# Patient Record
Sex: Female | Born: 1945 | Race: White | Hispanic: No | Smoking: Never smoker
Health system: Southern US, Community
[De-identification: ages and names within clinical notes are randomized; demographics above are authoritative.]

## PROBLEM LIST (undated history)

## (undated) DIAGNOSIS — I10 Essential (primary) hypertension: Secondary | ICD-10-CM

## (undated) DIAGNOSIS — G4730 Sleep apnea, unspecified: Secondary | ICD-10-CM

## (undated) DIAGNOSIS — I8390 Asymptomatic varicose veins of unspecified lower extremity: Secondary | ICD-10-CM

## (undated) DIAGNOSIS — J45909 Unspecified asthma, uncomplicated: Secondary | ICD-10-CM

## (undated) DIAGNOSIS — M1990 Unspecified osteoarthritis, unspecified site: Secondary | ICD-10-CM

## (undated) DIAGNOSIS — R569 Unspecified convulsions: Secondary | ICD-10-CM

## (undated) HISTORY — PX: OTHER SURGICAL HISTORY: SHX169

---

## 1978-11-16 HISTORY — PX: BREAST EXCISIONAL BIOPSY: SUR124

## 1982-11-16 HISTORY — PX: OTHER SURGICAL HISTORY: SHX169

## 1998-11-16 HISTORY — PX: REDUCTION MAMMAPLASTY: SUR839

## 2020-09-18 ENCOUNTER — Other Ambulatory Visit: Payer: Self-pay | Admitting: Internal Medicine

## 2020-09-18 DIAGNOSIS — Z1231 Encounter for screening mammogram for malignant neoplasm of breast: Secondary | ICD-10-CM

## 2020-12-03 ENCOUNTER — Other Ambulatory Visit: Payer: Self-pay

## 2020-12-03 ENCOUNTER — Ambulatory Visit
Admission: RE | Admit: 2020-12-03 | Discharge: 2020-12-03 | Disposition: A | Discharge status: Home / Self Care | Payer: Medicare HMO | Source: Ambulatory Visit | Attending: Internal Medicine | Admitting: Internal Medicine

## 2020-12-03 DIAGNOSIS — Z1231 Encounter for screening mammogram for malignant neoplasm of breast: Secondary | ICD-10-CM | POA: Diagnosis present

## 2020-12-16 ENCOUNTER — Ambulatory Visit: Payer: Medicare HMO | Attending: Internal Medicine

## 2020-12-16 DIAGNOSIS — G4733 Obstructive sleep apnea (adult) (pediatric): Secondary | ICD-10-CM | POA: Insufficient documentation

## 2020-12-16 DIAGNOSIS — G4761 Periodic limb movement disorder: Secondary | ICD-10-CM | POA: Diagnosis not present

## 2020-12-17 ENCOUNTER — Other Ambulatory Visit: Payer: Self-pay

## 2021-06-24 DIAGNOSIS — H25813 Combined forms of age-related cataract, bilateral: Secondary | ICD-10-CM | POA: Diagnosis not present

## 2021-08-04 DIAGNOSIS — Z6835 Body mass index (BMI) 35.0-35.9, adult: Secondary | ICD-10-CM | POA: Diagnosis not present

## 2021-08-04 DIAGNOSIS — Z9989 Dependence on other enabling machines and devices: Secondary | ICD-10-CM | POA: Diagnosis not present

## 2021-08-04 DIAGNOSIS — J4520 Mild intermittent asthma, uncomplicated: Secondary | ICD-10-CM | POA: Diagnosis not present

## 2021-08-04 DIAGNOSIS — G4733 Obstructive sleep apnea (adult) (pediatric): Secondary | ICD-10-CM | POA: Diagnosis not present

## 2021-08-04 DIAGNOSIS — R8290 Unspecified abnormal findings in urine: Secondary | ICD-10-CM | POA: Diagnosis not present

## 2021-08-04 DIAGNOSIS — R7309 Other abnormal glucose: Secondary | ICD-10-CM | POA: Diagnosis not present

## 2021-08-04 DIAGNOSIS — M25473 Effusion, unspecified ankle: Secondary | ICD-10-CM | POA: Diagnosis not present

## 2021-08-04 DIAGNOSIS — I10 Essential (primary) hypertension: Secondary | ICD-10-CM | POA: Diagnosis not present

## 2021-08-04 DIAGNOSIS — J302 Other seasonal allergic rhinitis: Secondary | ICD-10-CM | POA: Diagnosis not present

## 2021-08-11 DIAGNOSIS — Z6835 Body mass index (BMI) 35.0-35.9, adult: Secondary | ICD-10-CM | POA: Diagnosis not present

## 2021-08-11 DIAGNOSIS — Z9989 Dependence on other enabling machines and devices: Secondary | ICD-10-CM | POA: Diagnosis not present

## 2021-08-11 DIAGNOSIS — Z23 Encounter for immunization: Secondary | ICD-10-CM | POA: Diagnosis not present

## 2021-08-11 DIAGNOSIS — J302 Other seasonal allergic rhinitis: Secondary | ICD-10-CM | POA: Diagnosis not present

## 2021-08-11 DIAGNOSIS — G4733 Obstructive sleep apnea (adult) (pediatric): Secondary | ICD-10-CM | POA: Diagnosis not present

## 2021-08-11 DIAGNOSIS — I10 Essential (primary) hypertension: Secondary | ICD-10-CM | POA: Diagnosis not present

## 2021-08-11 DIAGNOSIS — L989 Disorder of the skin and subcutaneous tissue, unspecified: Secondary | ICD-10-CM | POA: Diagnosis not present

## 2021-08-11 DIAGNOSIS — M25473 Effusion, unspecified ankle: Secondary | ICD-10-CM | POA: Diagnosis not present

## 2021-08-22 DIAGNOSIS — G4733 Obstructive sleep apnea (adult) (pediatric): Secondary | ICD-10-CM | POA: Diagnosis not present

## 2021-09-22 DIAGNOSIS — D2272 Melanocytic nevi of left lower limb, including hip: Secondary | ICD-10-CM | POA: Diagnosis not present

## 2021-09-22 DIAGNOSIS — D2261 Melanocytic nevi of right upper limb, including shoulder: Secondary | ICD-10-CM | POA: Diagnosis not present

## 2021-09-22 DIAGNOSIS — D2262 Melanocytic nevi of left upper limb, including shoulder: Secondary | ICD-10-CM | POA: Diagnosis not present

## 2021-09-22 DIAGNOSIS — D225 Melanocytic nevi of trunk: Secondary | ICD-10-CM | POA: Diagnosis not present

## 2021-09-22 DIAGNOSIS — D2271 Melanocytic nevi of right lower limb, including hip: Secondary | ICD-10-CM | POA: Diagnosis not present

## 2021-09-22 DIAGNOSIS — L821 Other seborrheic keratosis: Secondary | ICD-10-CM | POA: Diagnosis not present

## 2021-12-17 DIAGNOSIS — N390 Urinary tract infection, site not specified: Secondary | ICD-10-CM | POA: Diagnosis not present

## 2021-12-17 DIAGNOSIS — R350 Frequency of micturition: Secondary | ICD-10-CM | POA: Diagnosis not present

## 2022-02-04 DIAGNOSIS — Z9989 Dependence on other enabling machines and devices: Secondary | ICD-10-CM | POA: Diagnosis not present

## 2022-02-04 DIAGNOSIS — I10 Essential (primary) hypertension: Secondary | ICD-10-CM | POA: Diagnosis not present

## 2022-02-04 DIAGNOSIS — L989 Disorder of the skin and subcutaneous tissue, unspecified: Secondary | ICD-10-CM | POA: Diagnosis not present

## 2022-02-04 DIAGNOSIS — R7309 Other abnormal glucose: Secondary | ICD-10-CM | POA: Diagnosis not present

## 2022-02-04 DIAGNOSIS — J302 Other seasonal allergic rhinitis: Secondary | ICD-10-CM | POA: Diagnosis not present

## 2022-02-04 DIAGNOSIS — M25473 Effusion, unspecified ankle: Secondary | ICD-10-CM | POA: Diagnosis not present

## 2022-02-04 DIAGNOSIS — G4733 Obstructive sleep apnea (adult) (pediatric): Secondary | ICD-10-CM | POA: Diagnosis not present

## 2022-02-04 DIAGNOSIS — Z6835 Body mass index (BMI) 35.0-35.9, adult: Secondary | ICD-10-CM | POA: Diagnosis not present

## 2022-02-04 DIAGNOSIS — R8290 Unspecified abnormal findings in urine: Secondary | ICD-10-CM | POA: Diagnosis not present

## 2022-02-12 DIAGNOSIS — I10 Essential (primary) hypertension: Secondary | ICD-10-CM | POA: Diagnosis not present

## 2022-02-12 DIAGNOSIS — Z6835 Body mass index (BMI) 35.0-35.9, adult: Secondary | ICD-10-CM | POA: Diagnosis not present

## 2022-02-12 DIAGNOSIS — Z9989 Dependence on other enabling machines and devices: Secondary | ICD-10-CM | POA: Diagnosis not present

## 2022-02-12 DIAGNOSIS — G4733 Obstructive sleep apnea (adult) (pediatric): Secondary | ICD-10-CM | POA: Diagnosis not present

## 2022-02-12 DIAGNOSIS — M25561 Pain in right knee: Secondary | ICD-10-CM | POA: Diagnosis not present

## 2022-02-12 DIAGNOSIS — Z0000 Encounter for general adult medical examination without abnormal findings: Secondary | ICD-10-CM | POA: Diagnosis not present

## 2022-02-12 DIAGNOSIS — Z1331 Encounter for screening for depression: Secondary | ICD-10-CM | POA: Diagnosis not present

## 2022-02-15 DIAGNOSIS — M25561 Pain in right knee: Secondary | ICD-10-CM | POA: Diagnosis not present

## 2022-02-15 DIAGNOSIS — M2391 Unspecified internal derangement of right knee: Secondary | ICD-10-CM | POA: Diagnosis not present

## 2022-02-24 DIAGNOSIS — M2391 Unspecified internal derangement of right knee: Secondary | ICD-10-CM | POA: Diagnosis not present

## 2022-02-24 DIAGNOSIS — M25561 Pain in right knee: Secondary | ICD-10-CM | POA: Diagnosis not present

## 2022-02-25 ENCOUNTER — Other Ambulatory Visit: Payer: Self-pay | Admitting: Sports Medicine

## 2022-02-25 DIAGNOSIS — M2391 Unspecified internal derangement of right knee: Secondary | ICD-10-CM

## 2022-02-25 DIAGNOSIS — M25561 Pain in right knee: Secondary | ICD-10-CM

## 2022-03-02 ENCOUNTER — Ambulatory Visit
Admission: RE | Admit: 2022-03-02 | Discharge: 2022-03-02 | Disposition: A | Discharge status: Home / Self Care | Payer: Medicare HMO | Source: Ambulatory Visit | Attending: Sports Medicine | Admitting: Sports Medicine

## 2022-03-02 DIAGNOSIS — M7121 Synovial cyst of popliteal space [Baker], right knee: Secondary | ICD-10-CM | POA: Diagnosis not present

## 2022-03-02 DIAGNOSIS — M7989 Other specified soft tissue disorders: Secondary | ICD-10-CM | POA: Diagnosis not present

## 2022-03-02 DIAGNOSIS — S83231A Complex tear of medial meniscus, current injury, right knee, initial encounter: Secondary | ICD-10-CM | POA: Diagnosis not present

## 2022-03-02 DIAGNOSIS — M2391 Unspecified internal derangement of right knee: Secondary | ICD-10-CM | POA: Insufficient documentation

## 2022-03-02 DIAGNOSIS — M1711 Unilateral primary osteoarthritis, right knee: Secondary | ICD-10-CM | POA: Diagnosis not present

## 2022-03-02 DIAGNOSIS — M25561 Pain in right knee: Secondary | ICD-10-CM | POA: Insufficient documentation

## 2022-03-11 DIAGNOSIS — M25461 Effusion, right knee: Secondary | ICD-10-CM | POA: Diagnosis not present

## 2022-03-11 DIAGNOSIS — S83241D Other tear of medial meniscus, current injury, right knee, subsequent encounter: Secondary | ICD-10-CM | POA: Diagnosis not present

## 2022-03-11 DIAGNOSIS — S8991XD Unspecified injury of right lower leg, subsequent encounter: Secondary | ICD-10-CM | POA: Diagnosis not present

## 2022-03-11 DIAGNOSIS — M25561 Pain in right knee: Secondary | ICD-10-CM | POA: Diagnosis not present

## 2022-03-11 DIAGNOSIS — M1711 Unilateral primary osteoarthritis, right knee: Secondary | ICD-10-CM | POA: Diagnosis not present

## 2022-03-12 ENCOUNTER — Other Ambulatory Visit: Payer: Self-pay

## 2022-03-12 ENCOUNTER — Other Ambulatory Visit: Payer: Self-pay | Admitting: Orthopedic Surgery

## 2022-03-12 ENCOUNTER — Encounter: Payer: Self-pay | Admitting: Orthopedic Surgery

## 2022-03-12 DIAGNOSIS — S83241A Other tear of medial meniscus, current injury, right knee, initial encounter: Secondary | ICD-10-CM | POA: Diagnosis not present

## 2022-03-19 ENCOUNTER — Other Ambulatory Visit: Payer: Self-pay

## 2022-03-19 ENCOUNTER — Ambulatory Visit: Payer: Medicare HMO | Admitting: Anesthesiology

## 2022-03-19 ENCOUNTER — Ambulatory Visit
Admission: RE | Admit: 2022-03-19 | Discharge: 2022-03-19 | Disposition: A | Discharge status: Home / Self Care | Payer: Medicare HMO | Source: Ambulatory Visit | Attending: Orthopedic Surgery | Admitting: Orthopedic Surgery

## 2022-03-19 ENCOUNTER — Encounter: Payer: Self-pay | Admitting: Orthopedic Surgery

## 2022-03-19 ENCOUNTER — Encounter
Admission: RE | Disposition: A | Discharge status: Home / Self Care | Payer: Self-pay | Source: Ambulatory Visit | Attending: Orthopedic Surgery

## 2022-03-19 DIAGNOSIS — M65861 Other synovitis and tenosynovitis, right lower leg: Secondary | ICD-10-CM | POA: Diagnosis not present

## 2022-03-19 DIAGNOSIS — X58XXXA Exposure to other specified factors, initial encounter: Secondary | ICD-10-CM | POA: Insufficient documentation

## 2022-03-19 DIAGNOSIS — S83241A Other tear of medial meniscus, current injury, right knee, initial encounter: Secondary | ICD-10-CM | POA: Diagnosis not present

## 2022-03-19 DIAGNOSIS — M2242 Chondromalacia patellae, left knee: Secondary | ICD-10-CM | POA: Diagnosis not present

## 2022-03-19 DIAGNOSIS — I10 Essential (primary) hypertension: Secondary | ICD-10-CM | POA: Diagnosis not present

## 2022-03-19 DIAGNOSIS — G4730 Sleep apnea, unspecified: Secondary | ICD-10-CM | POA: Diagnosis not present

## 2022-03-19 DIAGNOSIS — M659 Synovitis and tenosynovitis, unspecified: Secondary | ICD-10-CM | POA: Insufficient documentation

## 2022-03-19 DIAGNOSIS — J45909 Unspecified asthma, uncomplicated: Secondary | ICD-10-CM | POA: Diagnosis not present

## 2022-03-19 DIAGNOSIS — M94261 Chondromalacia, right knee: Secondary | ICD-10-CM | POA: Diagnosis not present

## 2022-03-19 DIAGNOSIS — Z6835 Body mass index (BMI) 35.0-35.9, adult: Secondary | ICD-10-CM | POA: Diagnosis not present

## 2022-03-19 DIAGNOSIS — M25861 Other specified joint disorders, right knee: Secondary | ICD-10-CM | POA: Diagnosis not present

## 2022-03-19 DIAGNOSIS — G8918 Other acute postprocedural pain: Secondary | ICD-10-CM | POA: Diagnosis not present

## 2022-03-19 DIAGNOSIS — S83231A Complex tear of medial meniscus, current injury, right knee, initial encounter: Secondary | ICD-10-CM | POA: Diagnosis not present

## 2022-03-19 DIAGNOSIS — M25561 Pain in right knee: Secondary | ICD-10-CM | POA: Diagnosis not present

## 2022-03-19 DIAGNOSIS — E785 Hyperlipidemia, unspecified: Secondary | ICD-10-CM | POA: Insufficient documentation

## 2022-03-19 HISTORY — PX: CHONDROPLASTY: SHX5177

## 2022-03-19 HISTORY — DX: Unspecified asthma, uncomplicated: J45.909

## 2022-03-19 HISTORY — DX: Essential (primary) hypertension: I10

## 2022-03-19 HISTORY — PX: KNEE ARTHROSCOPY: SHX127

## 2022-03-19 HISTORY — DX: Unspecified convulsions: R56.9

## 2022-03-19 HISTORY — DX: Asymptomatic varicose veins of unspecified lower extremity: I83.90

## 2022-03-19 HISTORY — DX: Sleep apnea, unspecified: G47.30

## 2022-03-19 HISTORY — DX: Unspecified osteoarthritis, unspecified site: M19.90

## 2022-03-19 SURGERY — ARTHROSCOPY, KNEE
Anesthesia: General | Site: Knee | Laterality: Right

## 2022-03-19 MED ORDER — ACETAMINOPHEN 325 MG PO TABS: 325.0000 mg | ORAL_TABLET | ORAL | Status: DC | PRN

## 2022-03-19 MED ORDER — OXYCODONE HCL 5 MG PO TABS: 5.0000 mg | ORAL_TABLET | Freq: Once | ORAL | Status: DC | PRN

## 2022-03-19 MED ORDER — GLYCOPYRROLATE 0.2 MG/ML IJ SOLN
INTRAMUSCULAR | Status: DC | PRN
  Administered 2022-03-19: .1 mg via INTRAVENOUS

## 2022-03-19 MED ORDER — LACTATED RINGERS IV SOLN: INTRAVENOUS | Status: DC

## 2022-03-19 MED ORDER — FENTANYL CITRATE (PF) 100 MCG/2ML IJ SOLN
INTRAMUSCULAR | Status: DC | PRN
  Administered 2022-03-19: 50 ug via INTRAVENOUS
  Administered 2022-03-19: 25 ug via INTRAVENOUS

## 2022-03-19 MED ORDER — OXYCODONE HCL 5 MG/5ML PO SOLN: 5.0000 mg | Freq: Once | ORAL | Status: DC | PRN

## 2022-03-19 MED ORDER — ONDANSETRON HCL 4 MG/2ML IJ SOLN
INTRAMUSCULAR | Status: DC | PRN
Start: 2022-03-19 — End: 2022-03-19
  Administered 2022-03-19: 4 mg via INTRAVENOUS

## 2022-03-19 MED ORDER — ACETAMINOPHEN 160 MG/5ML PO SOLN: 325.0000 mg | ORAL | Status: DC | PRN

## 2022-03-19 MED ORDER — IBUPROFEN 800 MG PO TABS: 800.0000 mg | ORAL_TABLET | Freq: Three times a day (TID) | ORAL | 1 refills | Status: AC

## 2022-03-19 MED ORDER — ONDANSETRON HCL 4 MG/2ML IJ SOLN: 4.0000 mg | Freq: Once | INTRAMUSCULAR | Status: DC | PRN

## 2022-03-19 MED ORDER — CEFAZOLIN SODIUM-DEXTROSE 2-4 GM/100ML-% IV SOLN
2.0000 g | INTRAVENOUS | Status: AC
Start: 2022-03-19 — End: 2022-03-19
  Administered 2022-03-19: 2 g via INTRAVENOUS

## 2022-03-19 MED ORDER — FENTANYL CITRATE PF 50 MCG/ML IJ SOSY: 25.0000 ug | PREFILLED_SYRINGE | INTRAMUSCULAR | Status: DC | PRN

## 2022-03-19 MED ORDER — BUPIVACAINE LIPOSOME 1.3 % IJ SUSP
INTRAMUSCULAR | Status: DC | PRN
  Administered 2022-03-19: 20 mL via PERINEURAL

## 2022-03-19 MED ORDER — MIDAZOLAM HCL 2 MG/2ML IJ SOLN
INTRAMUSCULAR | Status: DC | PRN
Start: 2022-03-19 — End: 2022-03-19
  Administered 2022-03-19: 2 mg via INTRAVENOUS

## 2022-03-19 MED ORDER — EPHEDRINE SULFATE (PRESSORS) 50 MG/ML IJ SOLN
INTRAMUSCULAR | Status: DC | PRN
  Administered 2022-03-19: 5 mg via INTRAVENOUS
  Administered 2022-03-19: 10 mg via INTRAVENOUS

## 2022-03-19 MED ORDER — LACTATED RINGERS IR SOLN
Status: DC | PRN
  Administered 2022-03-19: 12000 mL

## 2022-03-19 MED ORDER — BUPIVACAINE HCL (PF) 0.5 % IJ SOLN
INTRAMUSCULAR | Status: DC | PRN
  Administered 2022-03-19: 20 mL via PERINEURAL

## 2022-03-19 MED ORDER — PROPOFOL 10 MG/ML IV BOLUS
INTRAVENOUS | Status: DC | PRN
  Administered 2022-03-19: 140 mg via INTRAVENOUS

## 2022-03-19 MED ORDER — OXYCODONE HCL 5 MG PO TABS: 5.0000 mg | ORAL_TABLET | ORAL | 0 refills | Status: AC | PRN

## 2022-03-19 MED ORDER — LIDOCAINE HCL (CARDIAC) PF 100 MG/5ML IV SOSY
PREFILLED_SYRINGE | INTRAVENOUS | Status: DC | PRN
  Administered 2022-03-19: 50 mg via INTRATRACHEAL

## 2022-03-19 MED ORDER — DEXAMETHASONE SODIUM PHOSPHATE 4 MG/ML IJ SOLN
INTRAMUSCULAR | Status: DC | PRN
Start: 2022-03-19 — End: 2022-03-19
  Administered 2022-03-19: 4 mg via INTRAVENOUS

## 2022-03-19 MED ORDER — ACETAMINOPHEN 500 MG PO TABS: 1000.0000 mg | ORAL_TABLET | Freq: Three times a day (TID) | ORAL | 2 refills | Status: AC

## 2022-03-19 MED ORDER — ASPIRIN EC 325 MG PO TBEC: 325.0000 mg | DELAYED_RELEASE_TABLET | Freq: Every day | ORAL | 0 refills | Status: AC

## 2022-03-19 MED ORDER — ONDANSETRON 4 MG PO TBDP: 4.0000 mg | ORAL_TABLET | Freq: Three times a day (TID) | ORAL | 0 refills | Status: AC | PRN

## 2022-03-19 SURGICAL SUPPLY — 64 items
ADH SKN CLS APL DERMABOND .7 (GAUZE/BANDAGES/DRESSINGS) ×1
ADPR IRR PORT MULTIBAG TUBE (MISCELLANEOUS) ×1
APL PRP STRL LF DISP 70% ISPRP (MISCELLANEOUS) ×1
BLADE FULL RADIUS 3.5 (BLADE) ×2 IMPLANT
BLADE SURG 15 STRL LF DISP TIS (BLADE) ×1 IMPLANT
BLADE SURG 15 STRL SS (BLADE) ×2
BLADE SURG SZ11 CARB STEEL (BLADE) ×2 IMPLANT
BNDG ADH 5X4 AIR PERM ELC (GAUZE/BANDAGES/DRESSINGS) ×1
BNDG COHESIVE 4X5 TAN ST LF (GAUZE/BANDAGES/DRESSINGS) ×2 IMPLANT
BNDG COHESIVE 4X5 WHT NS (GAUZE/BANDAGES/DRESSINGS) ×2 IMPLANT
BNDG ESMARK 6X12 TAN STRL LF (GAUZE/BANDAGES/DRESSINGS) ×4 IMPLANT
BRACE KNEE POST OP SHORT (BRACE) IMPLANT
CHLORAPREP W/TINT 26 (MISCELLANEOUS) ×2 IMPLANT
COOLER POLAR GLACIER W/PUMP (MISCELLANEOUS) ×4 IMPLANT
COVER LIGHT HANDLE UNIVERSAL (MISCELLANEOUS) ×4 IMPLANT
CUFF TOURN SGL QUICK 34 (TOURNIQUET CUFF) ×2
CUFF TRNQT CYL 34X4X40X1 (TOURNIQUET CUFF) IMPLANT
DERMABOND ADVANCED (GAUZE/BANDAGES/DRESSINGS) ×1
DERMABOND ADVANCED .7 DNX12 (GAUZE/BANDAGES/DRESSINGS) IMPLANT
DRAPE EXTREMITY T 121X128X90 (DISPOSABLE) ×2 IMPLANT
DRAPE IMP U-DRAPE 54X76 (DRAPES) ×2 IMPLANT
DRILL FLIPCUTTER III 6-12 (ORTHOPEDIC DISPOSABLE SUPPLIES) IMPLANT
ELECT REM PT RETURN 9FT ADLT (ELECTROSURGICAL) ×2
ELECTRODE REM PT RTRN 9FT ADLT (ELECTROSURGICAL) IMPLANT
FLIPCUTTER III 6-12 AR-1204FF (ORTHOPEDIC DISPOSABLE SUPPLIES) ×2
GAUZE SPONGE 4X4 12PLY STRL (GAUZE/BANDAGES/DRESSINGS) ×2 IMPLANT
GLOVE SURG ENC MOIS LTX SZ7.5 (GLOVE) ×2 IMPLANT
GLOVE SURG UNDER LTX SZ8 (GLOVE) ×2 IMPLANT
GOWN STRL REIN 2XL XLG LVL4 (GOWN DISPOSABLE) ×2 IMPLANT
GOWN STRL REUS W/TWL LRG LVL3 (GOWN DISPOSABLE) ×2 IMPLANT
IMP SYS 2ND FIX PEEK 4.75X19.1 (Miscellaneous) ×2 IMPLANT
IMPL SYS 2ND FX PEEK 4.75X19.1 (Miscellaneous) IMPLANT
IMPL SYS MENISCAL ROOT REPAIR (Orthopedic Implant) ×1 IMPLANT
IV LACTATED RINGER IRRG 3000ML (IV SOLUTION) ×8
IV LR IRRIG 3000ML ARTHROMATIC (IV SOLUTION) ×4 IMPLANT
KIT TURNOVER KIT A (KITS) ×2 IMPLANT
MANIFOLD NEPTUNE II (INSTRUMENTS) ×2 IMPLANT
MAT ABSORB  FLUID 56X50 GRAY (MISCELLANEOUS) ×1
MAT ABSORB FLUID 56X50 GRAY (MISCELLANEOUS) ×1 IMPLANT
NDL SAFETY ECLIPSE 18X1.5 (NEEDLE) ×1 IMPLANT
NDL SUT 2-0 SCORPION KNEE (NEEDLE) IMPLANT
NEEDLE HYPO 18GX1.5 SHARP (NEEDLE) ×2
NEEDLE SUT 2-0 SCORPION KNEE (NEEDLE) ×2 IMPLANT
PACK ARTHROSCOPY KNEE (MISCELLANEOUS) ×2 IMPLANT
PAD ABD DERMACEA PRESS 5X9 (GAUZE/BANDAGES/DRESSINGS) ×2 IMPLANT
PAD WRAPON POLAR KNEE (MISCELLANEOUS) ×1 IMPLANT
PADDING CAST BLEND 6X4 STRL (MISCELLANEOUS) ×1 IMPLANT
PADDING STRL CAST 6IN (MISCELLANEOUS) ×1
PENCIL SMOKE EVACUATOR (MISCELLANEOUS) ×1 IMPLANT
SET Y ADAPTER MULIT-BAG IRRIG (MISCELLANEOUS) ×1 IMPLANT
SUT 0 TIGERLINK 1.5 FWIRE CLS (SUTURE) ×2
SUT ETHILON 3-0 FS-10 30 BLK (SUTURE) ×2
SUT MNCRL 4-0 (SUTURE) ×2
SUT MNCRL 4-0 27XMFL (SUTURE) ×1
SUT VIC AB 2-0 CT1 27 (SUTURE) ×2
SUT VIC AB 2-0 CT1 TAPERPNT 27 (SUTURE) IMPLANT
SUTURE 0 TIGRLNK 1.5 FWIR CLS (SUTURE) IMPLANT
SUTURE EHLN 3-0 FS-10 30 BLK (SUTURE) ×1 IMPLANT
SUTURE MNCRL 4-0 27XMF (SUTURE) IMPLANT
TOWEL OR 17X26 4PK STRL BLUE (TOWEL DISPOSABLE) ×4 IMPLANT
TUBING INFLOW SET DBFLO PUMP (TUBING) ×2 IMPLANT
TUBING OUTFLOW SET DBLFO PUMP (TUBING) ×2 IMPLANT
WAND WEREWOLF FLOW 90D (MISCELLANEOUS) ×2 IMPLANT
WRAPON POLAR PAD KNEE (MISCELLANEOUS) ×2

## 2022-03-19 NOTE — Anesthesia Preprocedure Evaluation (Signed)
Anesthesia Evaluation  ?Patient identified by MRN, date of birth, ID band ?Patient awake ? ? ? ?Reviewed: ?Allergy & Precautions, H&P , NPO status , Patient's Chart, lab work & pertinent test results ? ?Airway ?Mallampati: II ? ?TM Distance: >3 FB ?Neck ROM: full ? ? ? Dental ?no notable dental hx. ? ?  ?Pulmonary ?asthma , sleep apnea and Continuous Positive Airway Pressure Ventilation ,  ?  ?Pulmonary exam normal ?breath sounds clear to auscultation ? ? ? ? ? ? Cardiovascular ?hypertension, Normal cardiovascular exam ?Rhythm:regular Rate:Normal ? ?HLD ?  ?Neuro/Psych ?  ? GI/Hepatic ?  ?Endo/Other  ?Morbid obesity ? Renal/GU ?  ? ?  ?Musculoskeletal ? ? Abdominal ?  ?Peds ? Hematology ?  ?Anesthesia Other Findings ? ? Reproductive/Obstetrics ? ?  ? ? ? ? ? ? ? ? ? ? ? ? ? ?  ?  ? ? ? ? ? ? ? ? ?Anesthesia Physical ?Anesthesia Plan ? ?ASA: 3 ? ?Anesthesia Plan: General LMA  ? ?Post-op Pain Management: Minimal or no pain anticipated and Regional block  ? ?Induction:  ? ?PONV Risk Score and Plan: Treatment may vary due to age or medical condition, Ondansetron and Dexamethasone ? ?Airway Management Planned:  ? ?Additional Equipment:  ? ?Intra-op Plan:  ? ?Post-operative Plan:  ? ?Informed Consent: I have reviewed the patients History and Physical, chart, labs and discussed the procedure including the risks, benefits and alternatives for the proposed anesthesia with the patient or authorized representative who has indicated his/her understanding and acceptance.  ? ? ? ?Dental Advisory Given ? ?Plan Discussed with: CRNA ? ?Anesthesia Plan Comments:   ? ? ? ? ? ? ?Anesthesia Quick Evaluation ? ?

## 2022-03-19 NOTE — Discharge Instructions (Signed)
Arthroscopic Knee Surgery - Meniscus Repair ?  ?Post-Op Instructions ?  ?1. Bracing or crutches: Crutches will be provided at the time of discharge from the surgery center. Keep brace locked in extension at all times except as directed by physical therapy.  ?  ?2. Ice: You may be provided with a device [facility]) that allows you to ice the affected area effectively. Otherwise you can ice manually.  ?  ?3. Driving:  Plan on not driving for at least four weeks. Please note that you are advised NOT to drive while taking narcotic pain medications as you may be impaired and unsafe to drive. ?  ?4. Activity: Ankle pumps several times an hour while awake to prevent blood clots. Weight bearing: NO WEIGHT BEARING FOR 4 WEEKS (at most you may put your foot on the ground for balance only). Use crutches/walker for at least 6 weeks. Bending and straightening the knee is unlimited, but do not flex your knee past 90 degrees until cleared by your therapist. Elevate knee above heart level as much as possible for one week. Avoid standing more than 5 minutes (consecutively) for the first week. No exercise involving the knee until cleared by the surgeon or physical therapist.  Avoid long distance travel for 4 weeks.  ? ?5. Medications:  ?- You have been provided a prescription for narcotic pain medicine. After surgery, take 1-2 narcotic tablets every 4 hours only if needed for severe pain. ?- A prescription for anti-nausea medication will be provided in case the narcotic medicine causes nausea - take 1 tablet every 6 hours only if nauseated.  ?- Take ibuprofen 800 mg every 8 hours with food to reduce post-operative knee swelling. DO NOT STOP IBUPROFEN POST-OP UNTIL INSTRUCTED TO DO SO at first post-op office visit (10-14 days after surgery).  ?- Take enteric coated aspirin 325 mg once daily for 4 weeks to prevent blood clots.  ?-Take tylenol 1000 every 8 hours for pain.  May stop tylenol 3 days after surgery or when you are having  minimal pain. If your narcotic has tylenol (acetaminophen), please do not take a total of more than '3000mg'$ /day of tylenol.  ?  ?If you are taking prescription medication for anxiety, depression, insomnia, muscle spasm, chronic pain, or for attention deficit disorder you are advised that you are at a higher risk of adverse effects with use of narcotics post-op, including narcotic addiction/dependence, depressed breathing, death. ?If you use non-prescribed substances: alcohol, marijuana, cocaine, heroin, methamphetamines, etc., you are at a higher risk of adverse effects with use of narcotics post-op, including narcotic addiction/dependence, depressed breathing, death. ?You are advised that taking > 50 morphine milligram equivalents (MME) of narcotic pain medication per day results in twice the risk of overdose or death. For your prescription provided: oxycodone 5 mg - taking more than 6 tablets per day. Be advised that we will prescribe narcotics short-term, for acute post-operative pain only - 1 week for minor operations such as knee arthroscopy for meniscus tear resection, and 3 weeks for major operations such as knee repair/reconstruction surgeries.  ? ?6. Bandages: The physical therapist should change the bandages at the first post-op appointment. If needed, the dressing supplies have been provided to you. You may shower after this with waterproof bandaids covering the incisions.  ?  ?7. Physical Therapy: 2 times per week for the first 4 weeks, then 1-2 times per week from weeks 4-8 post-op. Therapy typically starts on post operative Day 3 or 4. You have been  provided an order for physical therapy. The therapist will provide home exercises. ?  ?8. Work: May return to full work when off of crutches. May do light duty/desk job in approximately 1-2 weeks when off of narcotics, pain is well-controlled, and swelling has decreased. ?  ?9. Post-Op Appointments: ?Your first post-op appointment will be with Dr. Posey Pronto in  approximately 2 weeks time.  ?  ?If you find that they have not been scheduled please call the Orthopaedic Appointment front desk at [phone]. ? ? ?

## 2022-03-19 NOTE — Anesthesia Procedure Notes (Addendum)
Anesthesia Regional Block: Popliteal block  ? ?Pre-Anesthetic Checklist: , timeout performed,  Correct Patient, Correct Site, Correct Laterality,  Correct Procedure, Correct Position, site marked,  Risks and benefits discussed,  Surgical consent,  Pre-op evaluation,  At surgeon's request and post-op pain management ? ?Laterality: Right ? ?Prep: chloraprep     ?  ?Needles:  ?Injection technique: Single-shot ? ?Needle Type: Echogenic Needle   ? ? ?Needle Length: 9cm  ?Needle Gauge: 21  ? ? ? ?Additional Needles: ? ? ?Procedures:,,,, ultrasound used (permanent image in chart),,    ?Narrative:  ?Start time: 03/19/2022 12:17 PM ?End time: 03/19/2022 12:31 PM ?Injection made incrementally with aspirations every 5 mL. ? ?Performed by: Personally  ?Anesthesiologist: Ronelle Nigh, MD ? ?Additional Notes: ?Functioning IV was confirmed and monitors applied. Ultrasound guidance: relevant anatomy identified, needle position confirmed, local anesthetic spread visualized around nerve(s)., vascular puncture avoided.  Image printed for medical record.  Negative aspiration and no paresthesias; incremental administration of local anesthetic. The patient tolerated the procedure well. Vitals signes recorded in RN notes.  12.5 ml 0.5% Bup, 12.5 ml Exparel ? ? ? ?

## 2022-03-19 NOTE — Anesthesia Procedure Notes (Signed)
Procedure Name: LMA Insertion ?Date/Time: 03/19/2022 1:09 PM ?Performed by: Mayme Genta, CRNA ?Pre-anesthesia Checklist: Patient identified, Emergency Drugs available, Suction available, Timeout performed and Patient being monitored ?Patient Re-evaluated:Patient Re-evaluated prior to induction ?Oxygen Delivery Method: Circle system utilized ?Preoxygenation: Pre-oxygenation with 100% oxygen ?Induction Type: IV induction ?LMA: LMA inserted ?LMA Size: 4.0 ?Number of attempts: 1 ?Placement Confirmation: positive ETCO2 and breath sounds checked- equal and bilateral ?Tube secured with: Tape ? ? ? ? ?

## 2022-03-19 NOTE — Anesthesia Postprocedure Evaluation (Signed)
Anesthesia Post Note ? ?Patient: Alexa Chambers ? ?Procedure(s) Performed: Right knee arthroscopy, medial meniscus root repair, chondroplasty (Right: Knee) ?CHONDROPLASTY (Right: Knee) ? ? ?  ?Patient location during evaluation: PACU ?Anesthesia Type: General ?Level of consciousness: awake and alert and oriented ?Pain management: satisfactory to patient ?Vital Signs Assessment: post-procedure vital signs reviewed and stable ?Respiratory status: spontaneous breathing, nonlabored ventilation and respiratory function stable ?Cardiovascular status: blood pressure returned to baseline and stable ?Postop Assessment: Adequate PO intake and No signs of nausea or vomiting ?Anesthetic complications: no ? ? ?No notable events documented. ? ?Raliegh Ip ? ? ? ? ? ?

## 2022-03-19 NOTE — Op Note (Signed)
DATE: 03/19/2022 ?  ?PRE-OP DIAGNOSIS:  ?1. Right medial meniscus root tear ?2. Right Medial compartment and patellofemoral compartment degenerative changes ?  ?POST-OP DIAGNOSIS:  ?1. Right medial meniscus root tear ?2. Right Medial compartment and patellofemoral compartment degenerative changes ?3. 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 ?PROCEDURES:  ?1. Right knee medial meniscus root repair  ?2. Right medial femoral condyle and patella chondroplasty  ?3. Right partial synovectomy of infrapatellar fat pad ?  ?SURGEON:  ?Langston Reusing, MD ?  ?ASSISTANT(S): Cecille Amsterdam, PA-S  ?  ?ANESTHESIA: regional adductor canal + popliteal nerve block + general ?  ?TOTAL IV FLUIDS: See anesthesia record ?  ?ESTIMATED BLOOD LOSS: 2cc ?  ?TOURNIQUET TIME:  62 min ?  ?DRAINS:  None. ?  ?SPECIMENS: None ?  ?IMPLANTS:  ?- Arthrex Biocomposite SwiveLock (x1) ?  ?  ?COMPLICATIONS: None ?  ?INDICATIONS: ?Alexa Chambers is a 76 y.o. female with L knee pain that has failed non-operative management. Clinical exam and radiographic studies were notable for left knee pain and swelling with instances of giving way with associated popping and catching on the medial aspect of her knee. Additionally, MRI showed a medial meniscus root tear with minimal degenerative changes to medial and patellofemoral compartments. Given the poor long-term prognosis of a meniscus root tear and high likelihood of significant progression of osteoarthritis along with the patient's desired activity level, we elected to proceed with the above procedure after a discussion of the risks, benefits, and alternatives to surgery.  ?  ?OPERATIVE FINDINGS:  ?  ?Examination under anesthesia: A careful examination under anesthesia was performed.  Passive range of motion was: Hyperextension: 2.  Extension: 0.  Flexion: 130.  Lachman: normal. Pivot Shift: normal.  Posterior drawer: normal.  Varus stability in full extension: normal.  Varus stability in 30 degrees of  flexion: normal.  Valgus stability in full extension: normal.  Valgus stability in 30 degrees of flexion: normal. ?  ?Intra-operative findings: A thorough arthroscopic examination of the knee was performed.  The findings are: ?1. Suprapatellar pouch: Normal ?2. Undersurface of median ridge: Focal grade 2-3 degenerative changes ?3. Medial patellar facet: Grade 1 degenerative changes ?4. Lateral patellar facet: Grade 1 degenerative changes ?5. Trochlea: Normal ?6. Lateral gutter/popliteus tendon: Normal ?7. Hoffa's fat pad: Inflamed and thickened and with synovitic appearance ?8. Medial gutter/plica: Normal ?9. ACL: Normal ?10. PCL: Normal ?11. Medial meniscus: Complete tear of the medial meniscus at the posterior root ?12. Medial compartment cartilage: Focal area of grade 2-3 degenerative changes on MFC measuring approximately 15 x 7 mm; otherwise normal medial femoral condyle, scattered areas of grade 1-2 changes on tibial plateau ?13. Lateral meniscus: Normal ?14. Lateral compartment cartilage: Grade 1-2 changes to tibial plateau adjacent to the lateral tibial spine; normal lateral femoral condyle ? ?DESCRIPTION OF PROCEDURE: ?I identified Alexa Chambers in the pre-operative holding area.  I marked the operative knee with my initials. I reviewed the risks and benefits of the proposed surgical intervention and the patient (and/or patient's guardian) wished to proceed. The patient was transferred to the operative suite and placed in the supine position with all bony prominences padded.  Anesthesia was administered. Appropriate IV antibiotics were administered prior to incision. The extremity was then prepped and draped in standard fashion. A time out was performed confirming the correct extremity, correct patient, and correct procedure. ?  ?The anterolateral portal was established with an 11 blade. The arthroscope was placed in the anterolateral portal and  then into the suprapatellar pouch.  A diagnostic knee scope  was completed with the above findings. Next the medial portal was established under needle localization. A combination of an oscillating shaver and ArthroCare wand were used to perform a partial synovectomy of the infrapatellar fat pad.  It was significantly inflamed and thickened.  Next, the MCL was pie-crusted to improve visualization of the posterior horn of the medial meniscus. The meniscus root tear was identified and probed to confirm our findings. Next, an Arthrex meniscus root aiming guide was used to mark out the tibial incision. An approximately 4cm vertical incision was made medial to the tibial tubercle. This was carried down to the sartorius fascia with bovie electrocautery, and the fascia was incised. An elevator was used to clear periosteum from the tibia in the area of the anticipated bone tunnel. Hemostasis was achieved. The guide was reinserted into the tibia and was placed over the anatomic footprint of the medial meniscus root. We then used a 6.87m FlipCutter to drill into the tibia and create a 663msocket for the meniscus root. A FiberStick was passed through our tibial tunnel and into the joint. This was retrieved and passed through the anterolateral portal.  ? ?Next, using a Meniscus Scorpion, an 0-FiberLink suture was passed just medial to the meniscus root in a luggage tag configuration.  A second stitch was passed in a similar fashion medial to the first stitch.  The first stitch was noted to have less than ideal fixation of the meniscus so a third stitch was passed as a backup stitch.  This combination allowed for excellent purchase of the meniscus root.  We ensured there was no soft tissue bridge between the sutures and pulled the passing stitch from the FiRiversidehrough the anteromedial portal. We then used the passing stitch to bring the sutures in the meniscus out through the tibial tunnel. These were then passed through an [employer]. Anchor hole was drilled ~3cm  distal previously drilled tibial tunnel. Anchor was inserted with appropriate tension while visualizing the repair with the arthroscope, and this achieved excellent interference fit. The meniscus root was probed and found to be stable.  A gentle chondroplasty of the medial femoral condyle and patella was then performed with an oscillating shaver such that there were stable cartilaginous edges. ? ?Any loose bony debris was removed from the knee joint with a shaver and excess fluid was evacuated from the joint. Closure of the portals with 3-0 Nylon was performed. The subdermal layer of the tibial incision was closed with 2-0 vicryl and the skin was closed with 4-0 Monocryl in a running fashion and Dermabond.  Xeroform gauze and dry sterile dressings were applied. A PolarCare and hinged knee brace were also applied.  ? ?Instrument, sponge, and needle counts were correct prior to wound closure and at the conclusion of the case.  ? ?Additionally, this case had increased complexity compared to standard meniscus repair given that the patient had a complete tear of the meniscus root. Repair of this tear involved making a separate open incision, drilling a tunnel through the tibia, and using an implant in the tibia for fixation, all of which would otherwise not occur for standard meniscal repairs.  The steps increased surgical time by approximately 20 minutes. ? ?DISPOSITION: PACU - hemodynamically stable.  ?  ?POSTOPERATIVE PLAN: ?The patient will be discharged home today.   ?  ?Non-weight bearing x 4 weeks. 50% WB from weeks 4-6. ASA for DVT ppx x  4 weeks, Narcotic medication as needed, NSAID, and acetaminophen as discussed pre-operatively. The patient will be attending physical therapy beginning 3-4 days post-op. Physical therapy per Meniscus Root Repair Rehab Guidelines. ?  ?Patient to return to clinic 10-14 days postop for suture removal.   ? ?

## 2022-03-19 NOTE — Transfer of Care (Signed)
Immediate Anesthesia Transfer of Care Note ? ?Patient: Alexa Chambers ? ?Procedure(s) Performed: Right knee arthroscopy, medial meniscus root repair, chondroplasty (Right: Knee) ?CHONDROPLASTY (Right: Knee) ? ?Patient Location: PACU ? ?Anesthesia Type: General LMA ? ?Level of Consciousness: awake, alert  and patient cooperative ? ?Airway and Oxygen Therapy: Patient Spontanous Breathing and Patient connected to supplemental oxygen ? ?Post-op Assessment: Post-op Vital signs reviewed, Patient's Cardiovascular Status Stable, Respiratory Function Stable, Patent Airway and No signs of Nausea or vomiting ? ?Post-op Vital Signs: Reviewed and stable ? ?Complications: No notable events documented. ? ?

## 2022-03-19 NOTE — Anesthesia Procedure Notes (Signed)
Anesthesia Regional Block: Adductor canal block  ? ?Pre-Anesthetic Checklist: , timeout performed,  Correct Patient, Correct Site, Correct Laterality,  Correct Procedure, Correct Position, site marked,  Risks and benefits discussed,  Surgical consent,  Pre-op evaluation,  At surgeon's request and post-op pain management ? ?Laterality: Right ? ?Prep: chloraprep     ?  ?Needles:  ?Injection technique: Single-shot ? ?Needle Type: Echogenic Needle   ? ? ?Needle Length: 9cm  ?Needle Gauge: 21  ? ? ? ?Additional Needles: ? ? ?Procedures:,,,, ultrasound used (permanent image in chart),,    ?Narrative:  ?Start time: 03/19/2022 12:17 PM ?End time: 03/19/2022 12:31 PM ?Injection made incrementally with aspirations every 5 mL. ? ?Performed by: Personally  ?Anesthesiologist: Ronelle Nigh, MD ? ?Additional Notes: ?Functioning IV was confirmed and monitors applied. Ultrasound guidance: relevant anatomy identified, needle position confirmed, local anesthetic spread visualized around nerve(s)., vascular puncture avoided.  Image printed for medical record.  Negative aspiration and no paresthesias; incremental administration of local anesthetic. The patient tolerated the procedure well. Vitals signes recorded in RN notes. 7.5 ml 0.5% Bup, 7.5 ml Exparel ? ? ? ?

## 2022-03-19 NOTE — H&P (Signed)
Paper H&P to be scanned into permanent record. H&P reviewed. No significant changes noted.  

## 2022-03-24 DIAGNOSIS — M25561 Pain in right knee: Secondary | ICD-10-CM | POA: Diagnosis not present

## 2022-03-24 DIAGNOSIS — R29898 Other symptoms and signs involving the musculoskeletal system: Secondary | ICD-10-CM | POA: Diagnosis not present

## 2022-03-24 DIAGNOSIS — M25661 Stiffness of right knee, not elsewhere classified: Secondary | ICD-10-CM | POA: Diagnosis not present

## 2022-03-24 DIAGNOSIS — Z9889 Other specified postprocedural states: Secondary | ICD-10-CM | POA: Diagnosis not present

## 2022-04-01 DIAGNOSIS — Z9889 Other specified postprocedural states: Secondary | ICD-10-CM | POA: Diagnosis not present

## 2022-04-01 DIAGNOSIS — M25561 Pain in right knee: Secondary | ICD-10-CM | POA: Diagnosis not present

## 2022-04-16 DIAGNOSIS — Z9889 Other specified postprocedural states: Secondary | ICD-10-CM | POA: Diagnosis not present

## 2022-04-16 DIAGNOSIS — M25561 Pain in right knee: Secondary | ICD-10-CM | POA: Diagnosis not present

## 2022-04-29 DIAGNOSIS — M25561 Pain in right knee: Secondary | ICD-10-CM | POA: Diagnosis not present

## 2022-04-29 DIAGNOSIS — Z9889 Other specified postprocedural states: Secondary | ICD-10-CM | POA: Diagnosis not present

## 2022-05-01 DIAGNOSIS — I10 Essential (primary) hypertension: Secondary | ICD-10-CM | POA: Diagnosis not present

## 2022-05-01 DIAGNOSIS — M1990 Unspecified osteoarthritis, unspecified site: Secondary | ICD-10-CM | POA: Diagnosis not present

## 2022-05-01 DIAGNOSIS — Z809 Family history of malignant neoplasm, unspecified: Secondary | ICD-10-CM | POA: Diagnosis not present

## 2022-05-01 DIAGNOSIS — G4733 Obstructive sleep apnea (adult) (pediatric): Secondary | ICD-10-CM | POA: Diagnosis not present

## 2022-05-01 DIAGNOSIS — Z6835 Body mass index (BMI) 35.0-35.9, adult: Secondary | ICD-10-CM | POA: Diagnosis not present

## 2022-05-01 DIAGNOSIS — I499 Cardiac arrhythmia, unspecified: Secondary | ICD-10-CM | POA: Diagnosis not present

## 2022-05-01 DIAGNOSIS — J45909 Unspecified asthma, uncomplicated: Secondary | ICD-10-CM | POA: Diagnosis not present

## 2022-05-05 DIAGNOSIS — M25561 Pain in right knee: Secondary | ICD-10-CM | POA: Diagnosis not present

## 2022-05-05 DIAGNOSIS — Z9889 Other specified postprocedural states: Secondary | ICD-10-CM | POA: Diagnosis not present

## 2022-05-07 DIAGNOSIS — Z9889 Other specified postprocedural states: Secondary | ICD-10-CM | POA: Diagnosis not present

## 2022-05-07 DIAGNOSIS — M25561 Pain in right knee: Secondary | ICD-10-CM | POA: Diagnosis not present

## 2022-05-22 DIAGNOSIS — R69 Illness, unspecified: Secondary | ICD-10-CM | POA: Diagnosis not present

## 2022-08-20 DIAGNOSIS — Z6835 Body mass index (BMI) 35.0-35.9, adult: Secondary | ICD-10-CM | POA: Diagnosis not present

## 2022-08-20 DIAGNOSIS — Z0000 Encounter for general adult medical examination without abnormal findings: Secondary | ICD-10-CM | POA: Diagnosis not present

## 2022-08-20 DIAGNOSIS — I10 Essential (primary) hypertension: Secondary | ICD-10-CM | POA: Diagnosis not present

## 2022-08-20 DIAGNOSIS — M25561 Pain in right knee: Secondary | ICD-10-CM | POA: Diagnosis not present

## 2022-08-20 DIAGNOSIS — G4733 Obstructive sleep apnea (adult) (pediatric): Secondary | ICD-10-CM | POA: Diagnosis not present

## 2022-08-20 DIAGNOSIS — R7309 Other abnormal glucose: Secondary | ICD-10-CM | POA: Diagnosis not present

## 2022-08-27 ENCOUNTER — Other Ambulatory Visit: Payer: Self-pay | Admitting: Internal Medicine

## 2022-08-27 DIAGNOSIS — Z1231 Encounter for screening mammogram for malignant neoplasm of breast: Secondary | ICD-10-CM

## 2022-08-27 DIAGNOSIS — R7989 Other specified abnormal findings of blood chemistry: Secondary | ICD-10-CM | POA: Diagnosis not present

## 2022-08-27 DIAGNOSIS — Z23 Encounter for immunization: Secondary | ICD-10-CM | POA: Diagnosis not present

## 2022-08-27 DIAGNOSIS — J302 Other seasonal allergic rhinitis: Secondary | ICD-10-CM | POA: Diagnosis not present

## 2022-08-27 DIAGNOSIS — I10 Essential (primary) hypertension: Secondary | ICD-10-CM | POA: Diagnosis not present

## 2022-08-27 DIAGNOSIS — J45909 Unspecified asthma, uncomplicated: Secondary | ICD-10-CM | POA: Diagnosis not present

## 2022-08-27 DIAGNOSIS — Z0000 Encounter for general adult medical examination without abnormal findings: Secondary | ICD-10-CM | POA: Diagnosis not present

## 2022-08-27 DIAGNOSIS — Z6835 Body mass index (BMI) 35.0-35.9, adult: Secondary | ICD-10-CM | POA: Diagnosis not present

## 2022-08-27 DIAGNOSIS — G4733 Obstructive sleep apnea (adult) (pediatric): Secondary | ICD-10-CM | POA: Diagnosis not present

## 2022-09-09 IMAGING — MR MR KNEE*R* W/O CM
7 series · 40 of 40 positions shown · non-contrast
Comparison: Right knee radiographs 02/12/2022

CLINICAL DATA: One month of pain deep behind the patella
progressively worse.

EXAM:
MRI OF THE RIGHT KNEE WITHOUT CONTRAST
TECHNIQUE: Multiplanar, multisequence MR imaging of the knee was performed. No
intravenous contrast was administered.

[Series 8: T2 fat-sat · axial · right · 4.0mm · 0.50mm/px · z∈[-65,+59]mm · 6 of 26 slices shown (1 of 3)]
[im 1/26]
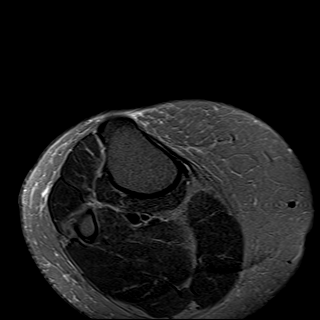
[im 6/26]
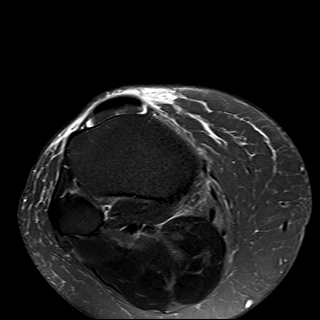
[im 11/26]
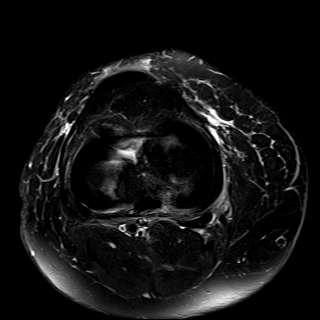
[im 16/26]
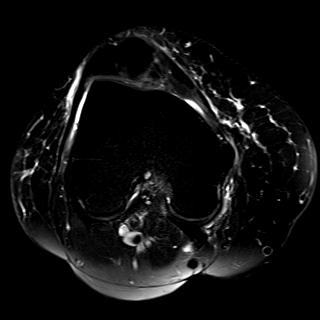
[im 21/26]
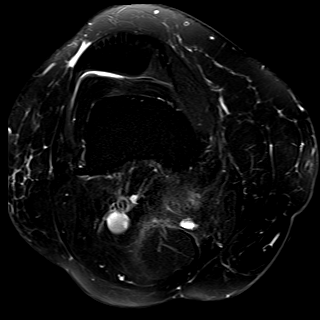
[im 26/26]
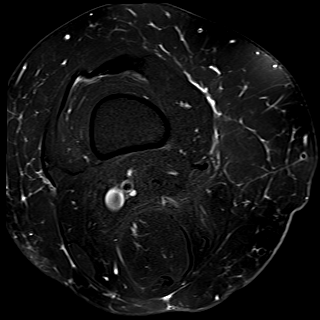

[Series 9: T1 · coronal · right · 4.0mm · 0.47mm/px · 6 of 32 slices shown]
[im 1/32]
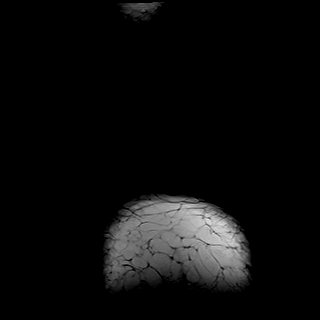
[im 7/32]
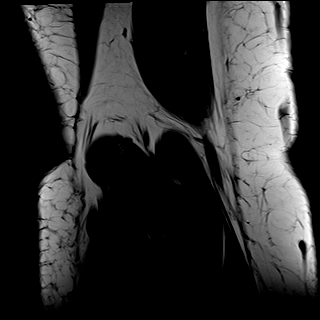
[im 13/32]
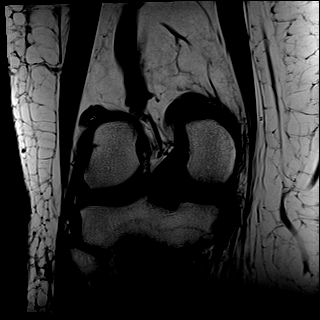
[im 19/32]
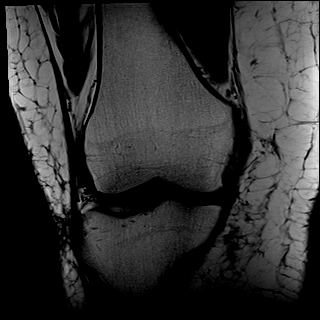
[im 25/32]
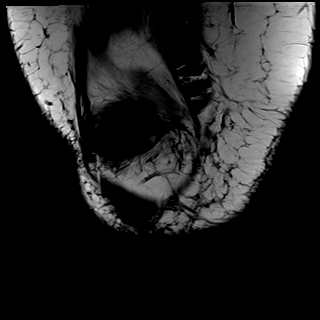
[im 32/32]
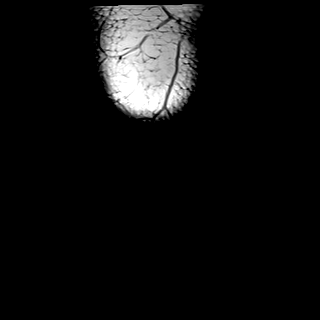

[Series 10: T2 fat-sat · coronal · right · 4.0mm · 0.47mm/px · 6 of 32 slices shown (2 of 3)]
[im 1/32]
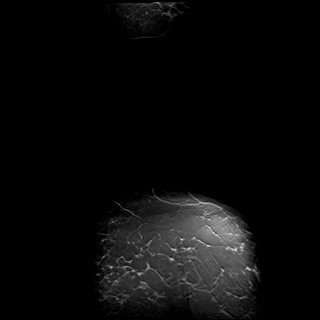
[im 7/32]
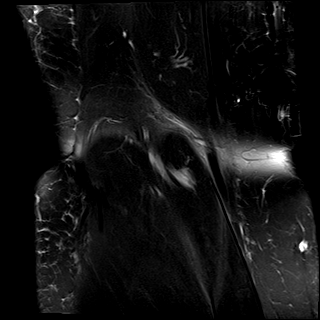
[im 13/32]
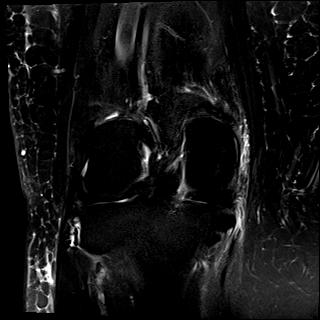
[im 19/32]
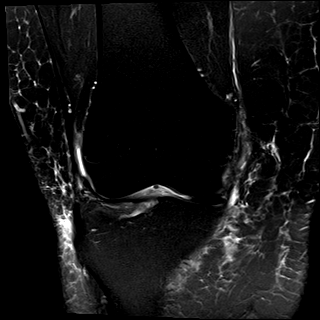
[im 25/32]
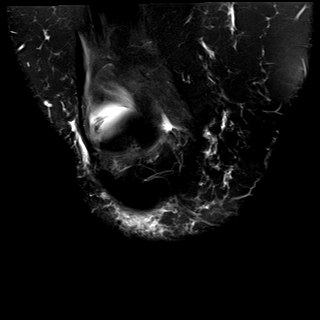
[im 32/32]
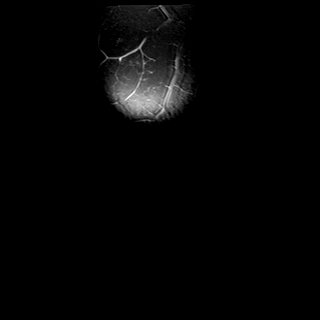

[Series 11: PD fat-sat · coronal · right · 4.0mm · 0.59mm/px · 6 of 32 slices shown (1 of 2)]
[im 1/32]
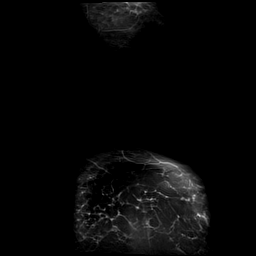
[im 7/32]
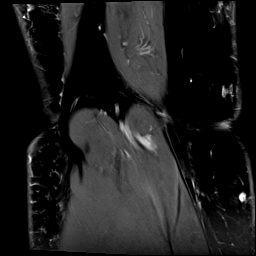
[im 13/32]
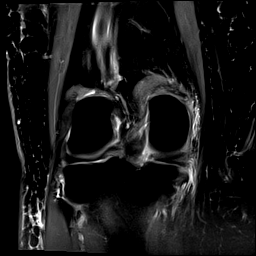
[im 19/32]
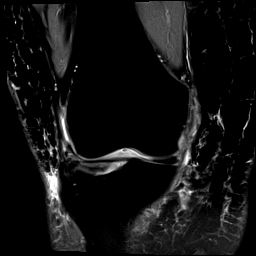
[im 25/32]
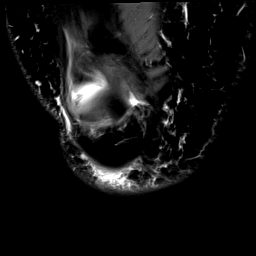
[im 32/32]
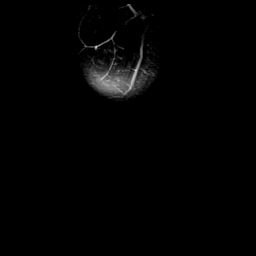

[Series 12: PD fat-sat · sagittal · right · 3.0mm · 0.47mm/px · 6 of 31 slices shown (2 of 2)]
[im 1/31]
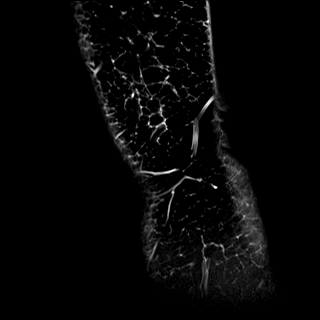
[im 7/31]
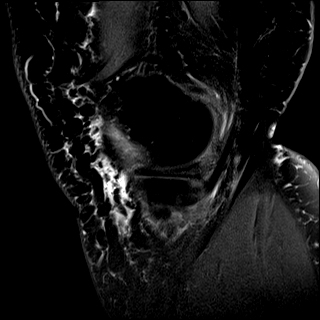
[im 13/31]
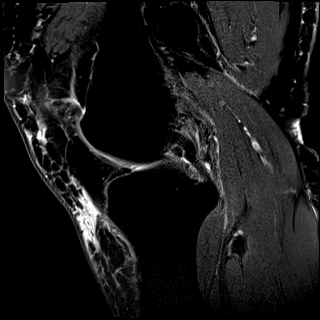
[im 19/31]
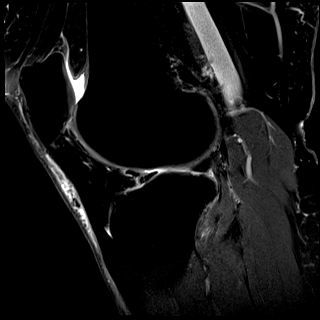
[im 25/31]
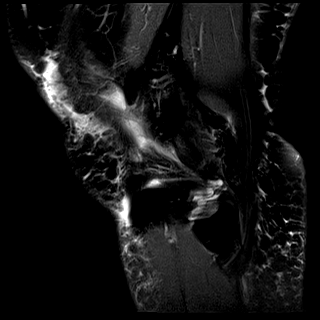
[im 31/31]
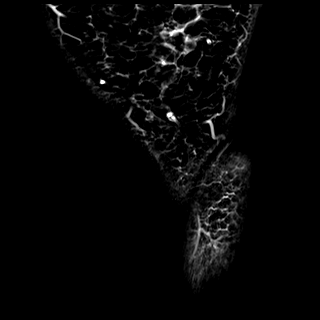

[Series 13: T2 fat-sat · sagittal · right · 3.0mm · 0.47mm/px · 7 of 36 slices shown (3 of 3)]
[im 1/36]
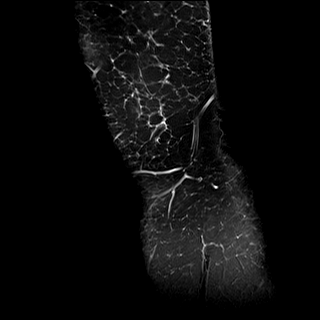
[im 6/36]
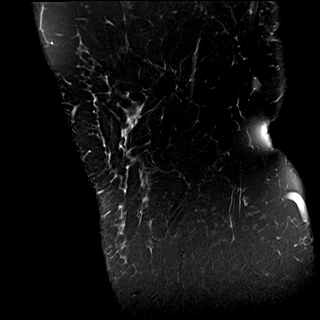
[im 12/36]
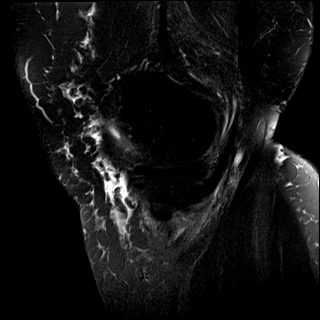
[im 18/36]
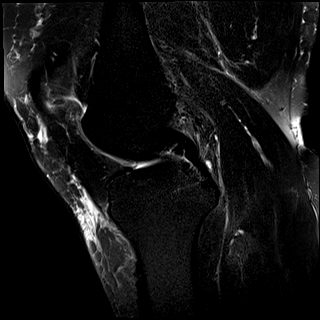
[im 24/36]
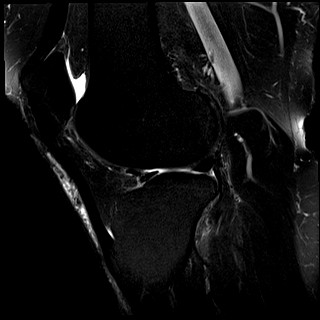
[im 30/36]
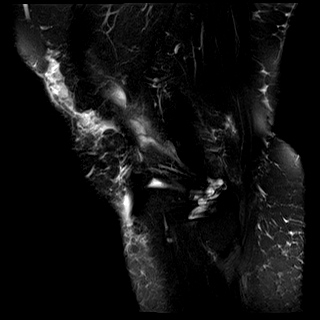
[im 36/36]
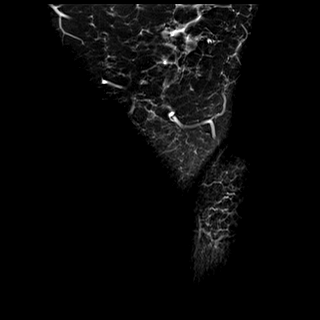

[Series 14: PD · oblique · right · 2.0mm · 0.47mm/px · 3 of 16 slices shown]
[im 1/16]
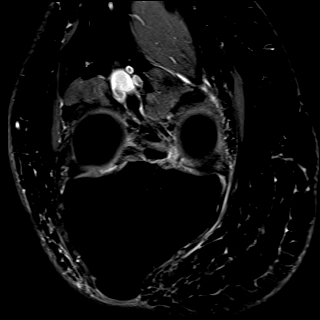
[im 8/16]
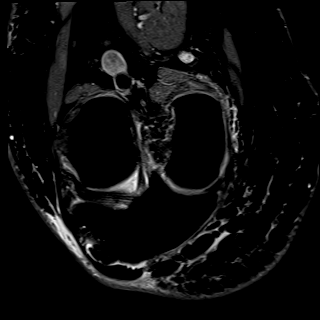
[im 16/16]
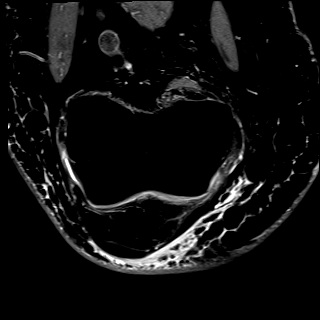

[40 of 40 positions shown; findings below may reference images not displayed]

FINDINGS: MENISCI

Medial meniscus: There is absence of the most of the meniscal tissue
within the 6 mm of the posterior horn of the medial meniscus closer
to the root (sagittal image 20 and coronal image 13) complex tear.
There is mild-to-moderate swelling and intermediate proton density
signal intrasubstance degeneration within the junction of the body
and posterior horn of the medial meniscus with mild extrusion of the
body of the medial meniscus.

Lateral meniscus: There is intermediate proton density signal
intrasubstance degeneration within the anterior segment of the body
of the lateral meniscus and the adjacent anterior horn. No definite
tear is seen extending through an articular surface of the lateral
meniscus.

LIGAMENTS

Cruciates: The ACL and PCL are intact.

Collaterals: The medial collateral ligament is intact. The fibular
collateral ligament, biceps femoris tendon, iliotibial band, and
popliteus tendon are intact.

CARTILAGE

Patellofemoral: Mild thinning of the patellar and trochlear
cartilage.

Medial: Mild-to-moderate thinning of the posterior aspect of the
weight-bearing medial femoral condyle cartilage. Moderate to
high-grade thinning of the posterior and medial aspect of the medial
tibial plateau cartilage.

Lateral: Moderate thinning of the mid to posterolateral tibial
plateau cartilage.

Joint: Tinyjoint effusion. Normal Hoffa's fat pad. No plical
thickening.

Popliteal Fossa:  Tiny Baker's cyst.

Extensor Mechanism:  Intact quadriceps tendon and patellar tendon.

Bones:  No acute fracture or dislocation.

Other: Mild edema and swelling within the subcutaneous fat anterior
to the mid to distal patellar tendon.
IMPRESSION: :
IMPRESSION: 1. Diffuse tear of the root of the posterior horn of the medial
meniscus. Mild extrusion of the body of the medial meniscus.
2. Mild to moderate tricompartmental cartilage degenerative changes.

## 2022-10-13 ENCOUNTER — Ambulatory Visit
Admission: RE | Admit: 2022-10-13 | Discharge: 2022-10-13 | Disposition: A | Discharge status: Home / Self Care | Payer: Medicare HMO | Source: Ambulatory Visit | Attending: Internal Medicine | Admitting: Internal Medicine

## 2022-10-13 DIAGNOSIS — Z1231 Encounter for screening mammogram for malignant neoplasm of breast: Secondary | ICD-10-CM | POA: Insufficient documentation

## 2022-10-16 ENCOUNTER — Other Ambulatory Visit: Payer: Self-pay | Admitting: Internal Medicine

## 2022-10-16 DIAGNOSIS — N63 Unspecified lump in unspecified breast: Secondary | ICD-10-CM

## 2022-10-16 DIAGNOSIS — R928 Other abnormal and inconclusive findings on diagnostic imaging of breast: Secondary | ICD-10-CM

## 2022-10-27 DIAGNOSIS — D2271 Melanocytic nevi of right lower limb, including hip: Secondary | ICD-10-CM | POA: Diagnosis not present

## 2022-10-27 DIAGNOSIS — D225 Melanocytic nevi of trunk: Secondary | ICD-10-CM | POA: Diagnosis not present

## 2022-10-27 DIAGNOSIS — D2261 Melanocytic nevi of right upper limb, including shoulder: Secondary | ICD-10-CM | POA: Diagnosis not present

## 2022-10-27 DIAGNOSIS — D2262 Melanocytic nevi of left upper limb, including shoulder: Secondary | ICD-10-CM | POA: Diagnosis not present

## 2022-10-27 DIAGNOSIS — D2272 Melanocytic nevi of left lower limb, including hip: Secondary | ICD-10-CM | POA: Diagnosis not present

## 2022-10-27 DIAGNOSIS — L821 Other seborrheic keratosis: Secondary | ICD-10-CM | POA: Diagnosis not present

## 2022-11-02 ENCOUNTER — Ambulatory Visit
Admission: RE | Admit: 2022-11-02 | Discharge: 2022-11-02 | Disposition: A | Discharge status: Home / Self Care | Payer: Medicare HMO | Source: Ambulatory Visit | Attending: Internal Medicine | Admitting: Internal Medicine

## 2022-11-02 DIAGNOSIS — R928 Other abnormal and inconclusive findings on diagnostic imaging of breast: Secondary | ICD-10-CM | POA: Insufficient documentation

## 2022-11-02 DIAGNOSIS — N6312 Unspecified lump in the right breast, upper inner quadrant: Secondary | ICD-10-CM | POA: Diagnosis not present

## 2022-11-02 DIAGNOSIS — N63 Unspecified lump in unspecified breast: Secondary | ICD-10-CM | POA: Diagnosis not present

## 2022-11-04 ENCOUNTER — Other Ambulatory Visit: Payer: Self-pay | Admitting: Internal Medicine

## 2022-11-04 DIAGNOSIS — R928 Other abnormal and inconclusive findings on diagnostic imaging of breast: Secondary | ICD-10-CM

## 2022-11-04 DIAGNOSIS — N63 Unspecified lump in unspecified breast: Secondary | ICD-10-CM

## 2022-11-24 ENCOUNTER — Ambulatory Visit
Admission: RE | Admit: 2022-11-24 | Discharge: 2022-11-24 | Disposition: A | Discharge status: Home / Self Care | Payer: Medicare HMO | Source: Ambulatory Visit | Attending: Internal Medicine | Admitting: Internal Medicine

## 2022-11-24 DIAGNOSIS — N63 Unspecified lump in unspecified breast: Secondary | ICD-10-CM

## 2022-11-24 DIAGNOSIS — R928 Other abnormal and inconclusive findings on diagnostic imaging of breast: Secondary | ICD-10-CM

## 2022-11-24 DIAGNOSIS — N6312 Unspecified lump in the right breast, upper inner quadrant: Secondary | ICD-10-CM | POA: Diagnosis not present

## 2022-11-24 DIAGNOSIS — D241 Benign neoplasm of right breast: Secondary | ICD-10-CM | POA: Diagnosis not present

## 2022-11-24 HISTORY — PX: BREAST BIOPSY: SHX20

## 2022-11-24 MED ORDER — LIDOCAINE HCL (PF) 1 % IJ SOLN
2.0000 mL | Freq: Once | INTRAMUSCULAR | Status: DC
  Filled 2022-11-24: qty 2

## 2022-11-24 MED ORDER — LIDOCAINE-EPINEPHRINE 1 %-1:100000 IJ SOLN
8.0000 mL | Freq: Once | INTRAMUSCULAR | Status: DC
  Filled 2022-11-24: qty 8

## 2022-11-25 ENCOUNTER — Encounter: Payer: Self-pay | Admitting: *Deleted

## 2022-11-25 LAB — SURGICAL PATHOLOGY

## 2022-11-25 NOTE — Progress Notes (Unsigned)
Referral recieved from [facility] for benign breast mass. Voicemail left asking for return call to discuss where she would like surgical consult.

## 2022-11-26 ENCOUNTER — Encounter: Payer: Self-pay | Admitting: *Deleted

## 2022-11-26 DIAGNOSIS — Z0100 Encounter for examination of eyes and vision without abnormal findings: Secondary | ICD-10-CM | POA: Diagnosis not present

## 2022-11-26 DIAGNOSIS — H524 Presbyopia: Secondary | ICD-10-CM | POA: Diagnosis not present

## 2022-11-26 NOTE — Progress Notes (Signed)
Alexa Chambers has been scheduled to see Dr. Peyton Najjar on 12/03/22.  Appt. Details given to her.   No further needs at this time.

## 2022-12-03 DIAGNOSIS — N6452 Nipple discharge: Secondary | ICD-10-CM | POA: Diagnosis not present

## 2022-12-03 DIAGNOSIS — D241 Benign neoplasm of right breast: Secondary | ICD-10-CM | POA: Diagnosis not present

## 2022-12-10 ENCOUNTER — Other Ambulatory Visit: Payer: Self-pay | Admitting: General Surgery

## 2022-12-10 DIAGNOSIS — N6452 Nipple discharge: Secondary | ICD-10-CM

## 2022-12-10 DIAGNOSIS — D241 Benign neoplasm of right breast: Secondary | ICD-10-CM

## 2022-12-23 ENCOUNTER — Ambulatory Visit
Admission: RE | Admit: 2022-12-23 | Discharge: 2022-12-23 | Disposition: A | Discharge status: Home / Self Care | Payer: Medicare HMO | Source: Ambulatory Visit | Attending: General Surgery | Admitting: General Surgery

## 2022-12-23 DIAGNOSIS — D241 Benign neoplasm of right breast: Secondary | ICD-10-CM | POA: Diagnosis not present

## 2022-12-23 DIAGNOSIS — N6452 Nipple discharge: Secondary | ICD-10-CM | POA: Diagnosis not present

## 2022-12-23 MED ORDER — GADOBUTROL 1 MMOL/ML IV SOLN
7.5000 mL | Freq: Once | INTRAVENOUS | Status: AC | PRN
  Administered 2022-12-23: 7.5 mL via INTRAVENOUS

## 2022-12-29 ENCOUNTER — Other Ambulatory Visit: Payer: Self-pay | Admitting: General Surgery

## 2022-12-29 DIAGNOSIS — R9389 Abnormal findings on diagnostic imaging of other specified body structures: Secondary | ICD-10-CM

## 2023-01-08 ENCOUNTER — Ambulatory Visit
Admission: RE | Admit: 2023-01-08 | Discharge: 2023-01-08 | Disposition: A | Discharge status: Home / Self Care | Payer: Medicare HMO | Source: Ambulatory Visit | Attending: General Surgery | Admitting: General Surgery

## 2023-01-08 DIAGNOSIS — R928 Other abnormal and inconclusive findings on diagnostic imaging of breast: Secondary | ICD-10-CM | POA: Diagnosis not present

## 2023-01-08 DIAGNOSIS — N6012 Diffuse cystic mastopathy of left breast: Secondary | ICD-10-CM | POA: Diagnosis not present

## 2023-01-08 DIAGNOSIS — D242 Benign neoplasm of left breast: Secondary | ICD-10-CM | POA: Diagnosis not present

## 2023-01-08 DIAGNOSIS — R9389 Abnormal findings on diagnostic imaging of other specified body structures: Secondary | ICD-10-CM

## 2023-01-08 MED ORDER — GADOPICLENOL 0.5 MMOL/ML IV SOLN
6.0000 mL | Freq: Once | INTRAVENOUS | Status: AC | PRN
  Administered 2023-01-08: 6 mL via INTRAVENOUS

## 2023-01-21 DIAGNOSIS — D242 Benign neoplasm of left breast: Secondary | ICD-10-CM | POA: Diagnosis not present

## 2023-01-21 DIAGNOSIS — D241 Benign neoplasm of right breast: Secondary | ICD-10-CM | POA: Diagnosis not present

## 2023-01-22 ENCOUNTER — Ambulatory Visit: Payer: Self-pay | Admitting: General Surgery

## 2023-01-22 ENCOUNTER — Other Ambulatory Visit: Payer: Self-pay | Admitting: General Surgery

## 2023-01-22 DIAGNOSIS — I10 Essential (primary) hypertension: Secondary | ICD-10-CM | POA: Diagnosis not present

## 2023-01-22 DIAGNOSIS — D241 Benign neoplasm of right breast: Secondary | ICD-10-CM

## 2023-01-22 DIAGNOSIS — R7309 Other abnormal glucose: Secondary | ICD-10-CM | POA: Diagnosis not present

## 2023-01-22 NOTE — H&P (Signed)
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  Ms. Santillana reports initially presented after her usual screening mammogram.  She was found with a suspected mass on the right breast.  This led to diagnostic mammogram.  Diagnostic mammogram and breast ultrasound was done and it showed an 8 mm mass in the retroareolar area.  Core biopsy of this mass showed intraductal papilloma without atypia or malignancy.  Upon questioning, patient endorses she has had a bilateral nipple discharge.  She describes nipple discharge as spontaneous and clear.  It happens mainly at night and she can see the drain in her close.  She denies any blood in the cloth.  Due to discharge being bilateral and no finding of the left breast I decided to proceed with bilateral breast MRI with IV and oral contrast.  The bilateral MRI showed an enhancing mass in the retroareolar area of the left breast.  MRI guided biopsy shows intraductal papilloma without atypia.  I discussed care with Dr. Enriqueta Shutter, breast radiologist, and due to the appearance, size of the enhancement and the persistent bilateral discharge they recommended excisional biopsy.   Patient denies any previous breast biopsy.  Patient denies any family history of breast cancer.  Patient denies any previous history of radiation.    PAST MEDICAL HISTORY:  Past Medical History:  Diagnosis Date   Asthma, unspecified asthma severity, unspecified whether complicated, unspecified whether persistent    Sleep apnea         PAST SURGICAL HISTORY:   Past Surgical History:  Procedure Laterality Date   Rt knee medial mensiscus root repair, femoral condyle and patella chondroplasty, partial synovectomy of infrpatellar fat pad Right 03/19/2022   Dr. Posey Pronto         MEDICATIONS:  Outpatient Encounter Medications as of 01/21/2023  Medication Sig Dispense Refill   acyclovir (ZOVIRAX) 400 MG  tablet Take 400 mg by mouth continuously as needed     albuterol 90 mcg/actuation inhaler Inhale 2 inhalations into the lungs every 6 (six) hours as needed for Wheezing for up to 180 days 1 each 5   amLODIPine (NORVASC) 2.5 MG tablet Take 1 tablet (2.5 mg total) by mouth once daily for 180 days 90 tablet 1   amLODIPine (NORVASC) 5 MG tablet TAKE 1 TABLET EVERY DAY 90 tablet 1   atorvastatin (LIPITOR) 20 MG tablet Take 1 tablet (20 mg total) by mouth once daily for 180 days 90 tablet 1   budesonide-formoteroL (SYMBICORT) 160-4.5 mcg/actuation inhaler Inhale 2 inhalations into the lungs 2 (two) times daily 30.6 g 1   calcium carbonate-vitamin D3 500 mg-10 mcg (400 unit) Chew Take by mouth Takes '600mg'$  1 tablet a day     fluticasone propionate (FLONASE) 50 mcg/actuation nasal spray Place 2 sprays into both nostrils once daily     FUROsemide (LASIX) 20 MG tablet TAKE 1 TABLET BY MOUTH ONCE DAILY AS NEEDED FOR EDEMA 90 tablet 1   meloxicam (MOBIC) 15 MG tablet      metoprolol succinate (TOPROL-XL) 25 MG XL tablet Take 1 tablet (25 mg total) by mouth once daily for 180 days 90 tablet 1   multivitamin (MULTIPLE VITAMINS DAILY ORAL) Take by mouth 2 tablespoons qd     No facility-administered encounter medications on file as of 01/21/2023.     ALLERGIES:   Patient has no known allergies.   SOCIAL HISTORY:  Social History   Socioeconomic History  Marital status: Single  Tobacco Use   Smoking status: Never   Smokeless tobacco: Never  Vaping Use   Vaping Use: Never used  Substance and Sexual Activity   Alcohol use: Yes   Drug use: Never   Sexual activity: Defer    FAMILY HISTORY:  History reviewed. No pertinent family history.   GENERAL REVIEW OF SYSTEMS:   General ROS: negative for - chills, fatigue, fever, weight gain or weight loss Allergy and Immunology ROS: negative for - hives  Hematological and Lymphatic ROS: negative for - bleeding problems or bruising, negative for palpable  nodes Endocrine ROS: negative for - heat or cold intolerance, hair changes Respiratory ROS: negative for - cough, shortness of breath or wheezing Cardiovascular ROS: no chest pain or palpitations GI ROS: negative for nausea, vomiting, abdominal pain, diarrhea, constipation Musculoskeletal ROS: negative for - joint swelling or muscle pain Neurological ROS: negative for - confusion, syncope Dermatological ROS: negative for pruritus and rash  PHYSICAL EXAM:  Vitals:   01/21/23 1136  BP: 123/80  Pulse: 84  .  Ht:165.1 cm ('5\' 5"'$ ) Wt:97.1 kg (214 lb) [id number] surface area is 2.11 meters squared. Body mass index is 35.61 kg/m.Marland Kitchen   GENERAL: Alert, active, oriented x3  HEENT: Pupils equal reactive to light. Extraocular movements are intact. Sclera clear. Palpebral conjunctiva normal red color.Pharynx clear.  NECK: Supple with no palpable mass and no adenopathy.  LUNGS: Sound clear with no rales rhonchi or wheezes.  HEART: Regular rhythm S1 and S2 without murmur.  BREAST: Examined in sitting and by position.  There was no palpable masses, skin changes, nipple retraction.  There was spontaneous clear nipple discharge.  EXTREMITIES: Well-developed well-nourished symmetrical with no dependent edema.  NEUROLOGICAL: Awake alert oriented, facial expression symmetrical, moving all extremities.      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  On biopsy they have no atypia or sign of malignancy.  Due to the appearance and size of the papilloma especially the 1 on the left side and the persistent nipple discharge, excisional biopsy is recommended.  I discussed with patient about the surgery of radiofrequency tag bilateral excisional biopsy of the intraductal papillomas.  Discussed with patient the benefits and the goal of the surgery to rule out malignancy.  Discussed with patient the risks of  surgery that includes bleeding, infection, seroma, hematoma, cosmetic discrepancy, risk of anesthesia, among others.  The patient reports she understood and agreed to proceed with bilateral radiofrequency tag excisional biopsies         PLAN:  1.  Bilateral RF tag breast excisional biopsy (19125) 2.  Avoid taking aspirin 5 days before the procedure 3.  Contact us if you have any concern  Patient verbalized understanding, all questions were answered, and were agreeable with the plan outlined above.   Herbert Pun, MD  Electronically signed by Herbert Pun, MD

## 2023-01-22 NOTE — H&P (View-Only) (Signed)
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  Alexa Chambers reports initially presented after her usual screening mammogram.  She was found with a suspected mass on the right breast.  This led to diagnostic mammogram.  Diagnostic mammogram and breast ultrasound was done and it showed an 8 mm mass in the retroareolar area.  Core biopsy of this mass showed intraductal papilloma without atypia or malignancy.  Upon questioning, patient endorses she has had a bilateral nipple discharge.  She describes nipple discharge as spontaneous and clear.  It happens mainly at night and she can see the drain in her close.  She denies any blood in the cloth.  Due to discharge being bilateral and no finding of the left breast I decided to proceed with bilateral breast MRI with IV and oral contrast.  The bilateral MRI showed an enhancing mass in the retroareolar area of the left breast.  MRI guided biopsy shows intraductal papilloma without atypia.  I discussed care with Dr. Maynard, breast radiologist, and due to the appearance, size of the enhancement and the persistent bilateral discharge they recommended excisional biopsy.   Patient denies any previous breast biopsy.  Patient denies any family history of breast cancer.  Patient denies any previous history of radiation.    PAST MEDICAL HISTORY:  Past Medical History:  Diagnosis Date   Asthma, unspecified asthma severity, unspecified whether complicated, unspecified whether persistent    Sleep apnea         PAST SURGICAL HISTORY:   Past Surgical History:  Procedure Laterality Date   Rt knee medial mensiscus root repair, femoral condyle and patella chondroplasty, partial synovectomy of infrpatellar fat pad Right 03/19/2022   Dr. Patel         MEDICATIONS:  Outpatient Encounter Medications as of 01/21/2023  Medication Sig Dispense Refill   acyclovir (ZOVIRAX) 400 MG  tablet Take 400 mg by mouth continuously as needed     albuterol 90 mcg/actuation inhaler Inhale 2 inhalations into the lungs every 6 (six) hours as needed for Wheezing for up to 180 days 1 each 5   amLODIPine (NORVASC) 2.5 MG tablet Take 1 tablet (2.5 mg total) by mouth once daily for 180 days 90 tablet 1   amLODIPine (NORVASC) 5 MG tablet TAKE 1 TABLET EVERY DAY 90 tablet 1   atorvastatin (LIPITOR) 20 MG tablet Take 1 tablet (20 mg total) by mouth once daily for 180 days 90 tablet 1   budesonide-formoteroL (SYMBICORT) 160-4.5 mcg/actuation inhaler Inhale 2 inhalations into the lungs 2 (two) times daily 30.6 g 1   calcium carbonate-vitamin D3 500 mg-10 mcg (400 unit) Chew Take by mouth Takes 600mg 1 tablet a day     fluticasone propionate (FLONASE) 50 mcg/actuation nasal spray Place 2 sprays into both nostrils once daily     FUROsemide (LASIX) 20 MG tablet TAKE 1 TABLET BY MOUTH ONCE DAILY AS NEEDED FOR EDEMA 90 tablet 1   meloxicam (MOBIC) 15 MG tablet      metoprolol succinate (TOPROL-XL) 25 MG XL tablet Take 1 tablet (25 mg total) by mouth once daily for 180 days 90 tablet 1   multivitamin (MULTIPLE VITAMINS DAILY ORAL) Take by mouth 2 tablespoons qd     No facility-administered encounter medications on file as of 01/21/2023.     ALLERGIES:   Patient has no known allergies.   SOCIAL HISTORY:  Social History   Socioeconomic History     Marital status: Single  Tobacco Use   Smoking status: Never   Smokeless tobacco: Never  Vaping Use   Vaping Use: Never used  Substance and Sexual Activity   Alcohol use: Yes   Drug use: Never   Sexual activity: Defer    FAMILY HISTORY:  History reviewed. No pertinent family history.   GENERAL REVIEW OF SYSTEMS:   General ROS: negative for - chills, fatigue, fever, weight gain or weight loss Allergy and Immunology ROS: negative for - hives  Hematological and Lymphatic ROS: negative for - bleeding problems or bruising, negative for palpable  nodes Endocrine ROS: negative for - heat or cold intolerance, hair changes Respiratory ROS: negative for - cough, shortness of breath or wheezing Cardiovascular ROS: no chest pain or palpitations GI ROS: negative for nausea, vomiting, abdominal pain, diarrhea, constipation Musculoskeletal ROS: negative for - joint swelling or muscle pain Neurological ROS: negative for - confusion, syncope Dermatological ROS: negative for pruritus and rash  PHYSICAL EXAM:  Vitals:   01/21/23 1136  BP: 123/80  Pulse: 84  .  Ht:165.1 cm (5' 5") Wt:97.1 kg (214 lb) BSA:Body surface area is 2.11 meters squared. Body mass index is 35.61 kg/m..   GENERAL: Alert, active, oriented x3  HEENT: Pupils equal reactive to light. Extraocular movements are intact. Sclera clear. Palpebral conjunctiva normal red color.Pharynx clear.  NECK: Supple with no palpable mass and no adenopathy.  LUNGS: Sound clear with no rales rhonchi or wheezes.  HEART: Regular rhythm S1 and S2 without murmur.  BREAST: Examined in sitting and by position.  There was no palpable masses, skin changes, nipple retraction.  There was spontaneous clear nipple discharge.  EXTREMITIES: Well-developed well-nourished symmetrical with no dependent edema.  NEUROLOGICAL: Awake alert oriented, facial expression symmetrical, moving all extremities.      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  On biopsy they have no atypia or sign of malignancy.  Due to the appearance and size of the papilloma especially the 1 on the left side and the persistent nipple discharge, excisional biopsy is recommended.  I discussed with patient about the surgery of radiofrequency tag bilateral excisional biopsy of the intraductal papillomas.  Discussed with patient the benefits and the goal of the surgery to rule out malignancy.  Discussed with patient the risks of  surgery that includes bleeding, infection, seroma, hematoma, cosmetic discrepancy, risk of anesthesia, among others.  The patient reports she understood and agreed to proceed with bilateral radiofrequency tag excisional biopsies         PLAN:  1.  Bilateral RF tag breast excisional biopsy (19125) 2.  Avoid taking aspirin 5 days before the procedure 3.  Contact us if you have any concern  Patient verbalized understanding, all questions were answered, and were agreeable with the plan outlined above.   Amarea Macdowell Cintron-Diaz, MD  Electronically signed by Rosezella Kronick Cintron-Diaz, MD  

## 2023-01-25 ENCOUNTER — Other Ambulatory Visit: Payer: Self-pay | Admitting: General Surgery

## 2023-01-25 DIAGNOSIS — R928 Other abnormal and inconclusive findings on diagnostic imaging of breast: Secondary | ICD-10-CM

## 2023-01-26 ENCOUNTER — Encounter
Admission: RE | Admit: 2023-01-26 | Discharge: 2023-01-26 | Disposition: A | Discharge status: Home / Self Care | Payer: Medicare HMO | Source: Ambulatory Visit | Attending: General Surgery | Admitting: General Surgery

## 2023-01-26 ENCOUNTER — Other Ambulatory Visit: Payer: Self-pay

## 2023-01-26 VITALS — Ht 65.0 in | Wt 214.0 lb

## 2023-01-26 DIAGNOSIS — Z01818 Encounter for other preprocedural examination: Secondary | ICD-10-CM | POA: Diagnosis not present

## 2023-01-26 DIAGNOSIS — I10 Essential (primary) hypertension: Secondary | ICD-10-CM | POA: Insufficient documentation

## 2023-01-26 LAB — BASIC METABOLIC PANEL
Anion gap: 9 (ref 5–15)
BUN: 14 mg/dL (ref 8–23)
CO2: 26 mmol/L (ref 22–32)
Calcium: 9.6 mg/dL (ref 8.9–10.3)
Chloride: 104 mmol/L (ref 98–111)
Creatinine, Ser: 0.84 mg/dL (ref 0.44–1.00)
GFR, Estimated: >60 mL/min (ref >60)
Glucose, Bld: 105 mg/dL — ABNORMAL HIGH (ref 70–99)
Potassium: 4.2 mmol/L (ref 3.5–5.1)
Sodium: 139 mmol/L (ref 135–145)

## 2023-01-26 LAB — CBC
HCT: 42.7 % (ref 36.0–46.0)
Hemoglobin: 13.8 g/dL (ref 12.0–15.0)
MCH: 29.2 pg (ref 26.0–34.0)
MCHC: 32.3 g/dL (ref 30.0–36.0)
MCV: 90.5 fL (ref 80.0–100.0)
Platelets: 262 10*3/uL (ref 150–400)
RBC: 4.72 MIL/uL (ref 3.87–5.11)
RDW: 14.5 % (ref 11.5–15.5)
WBC: 7.7 10*3/uL (ref 4.0–10.5)
nRBC: 0 % (ref 0.0–0.2)

## 2023-01-26 NOTE — Patient Instructions (Addendum)
Your procedure is scheduled on: Friday January 29, 2023. Report to the Registration Desk on the 1st floor of the [facility]. To find out your arrival time, please call [phone] between 1PM - 3PM on: Thursday March 14, 20243 If your arrival time is 6:00 am, do not arrive before that time as the [facility] entrance doors do not open until 6:00 am.  REMEMBER: Instructions that are not followed completely may result in serious medical risk, up to and including death; or upon the discretion of your surgeon and anesthesiologist your surgery may need to be rescheduled.  Do not eat food or drink fluids after midnight the night before surgery.  No gum chewing or hard candies.   One week prior to surgery: Stop Anti-inflammatories (NSAIDS) such as Advil, Aleve, Ibuprofen, Motrin, Naproxen, Naprosyn and Aspirin based products such as Excedrin, Goody's Powder, BC Powder. Stop ALL OVER THE COUNTER supplements until after surgery. You may however, continue to take Tylenol if needed for pain up until the day of surgery.  Continue taking all prescribed medications with the exception of the following:   Follow recommendations from Cardiologist or PCP regarding stopping blood thinners.  TAKE ONLY THESE MEDICATIONS THE MORNING OF SURGERY WITH A SIP OF WATER:  None   Use inhalers on the day of surgery and bring to the hospital. albuterol (VENTOLIN HFA) 108 (90 Base) MCG/ACT inhaler  budesonide-formoterol (SYMBICORT) 80-4.5 MCG/ACT inhaler    No Alcohol for 24 hours before or after surgery.  No Smoking including e-cigarettes for 24 hours before surgery.  No chewable tobacco products for at least 6 hours before surgery.  No nicotine patches on the day of surgery.  Do not use any "recreational" drugs for at least a week (preferably 2 weeks) before your surgery.  Please be advised that the combination of cocaine and anesthesia may have negative outcomes, up to and including death. If you test  positive for cocaine, your surgery will be cancelled.  On the morning of surgery brush your teeth with toothpaste and water, you may rinse your mouth with mouthwash if you wish. Do not swallow any toothpaste or mouthwash.  Use CHG Soap or wipes as directed on instruction sheet.  Do not wear jewelry, make-up, hairpins, clips or nail polish.  Do not wear lotions, powders, or perfumes.   Do not shave body hair from the neck down 48 hours before surgery.  Contact lenses, hearing aids and dentures may not be worn into surgery.  Do not bring valuables to the hospital. [facility] is not responsible for any missing/lost belongings or valuables.   Notify your doctor if there is any change in your medical condition (cold, fever, infection).  Wear comfortable clothing (specific to your surgery type) to the hospital.  After surgery, you can help prevent lung complications by doing breathing exercises.  Take deep breaths and cough every 1-2 hours. Your doctor may order a device called an Incentive Spirometer to help you take deep breaths. When coughing or sneezing, hold a pillow firmly against your incision with both hands. This is called "splinting." Doing this helps protect your incision. It also decreases belly discomfort.  If you are being admitted to the hospital overnight, leave your suitcase in the car. After surgery it may be brought to your room.  In case of increased patient census, it may be necessary for you, the patient, to continue your postoperative care in the Same Day Surgery department.  If you are being discharged the day  of surgery, you will not be allowed to drive home. You will need a responsible individual to drive you home and stay with you for 24 hours after surgery.   If you are taking public transportation, you will need to have a responsible individual with you.  Please call the [facility] Dept. at [phone] if you have any questions about these  instructions.  Surgery Visitation Policy:  Patients undergoing a surgery or procedure may have two family members or support persons with them as long as the person is not COVID-19 positive or experiencing its symptoms.   Inpatient Visitation:    Visiting hours are 7 a.m. to 8 p.m. Up to four visitors are allowed at one time in a patient room. The visitors may rotate out with other people during the day. One designated support person (adult) may remain overnight.  Due to an increase in RSV and influenza rates and associated hospitalizations, children ages 44 and under will not be able to visit patients in [facility]. Masks continue to be strongly recommended.     Preparing for Surgery with CHLORHEXIDINE GLUCONATE (CHG) Soap  Chlorhexidine Gluconate (CHG) Soap  o An antiseptic cleaner that kills germs and bonds with the skin to continue killing germs even after washing  o Used for showering the night before surgery and morning of surgery  Before surgery, you can play an important role by reducing the number of germs on your skin.  CHG (Chlorhexidine gluconate) soap is an antiseptic cleanser which kills germs and bonds with the skin to continue killing germs even after washing.  Please do not use if you have an allergy to CHG or antibacterial soaps. If your skin becomes reddened/irritated stop using the CHG.  1. Shower the NIGHT BEFORE SURGERY and the MORNING OF SURGERY with CHG soap.  2. If you choose to wash your hair, wash your hair first as usual with your normal shampoo.  3. After shampooing, rinse your hair and body thoroughly to remove the shampoo.  4. Use CHG as you would any other liquid soap. You can apply CHG directly to the skin and wash gently with a scrungie or a clean washcloth.  5. Apply the CHG soap to your body only from the neck down. Do not use on open wounds or open sores. Avoid contact with your eyes, ears, mouth, and genitals (private parts). Wash  face and genitals (private parts) with your normal soap.  6. Wash thoroughly, paying special attention to the area where your surgery will be performed.  7. Thoroughly rinse your body with warm water.  8. Do not shower/wash with your normal soap after using and rinsing off the CHG soap.  9. Pat yourself dry with a clean towel.  10. Wear clean pajamas to bed the night before surgery.  12. Place clean sheets on your bed the night of your first shower and do not sleep with pets.  13. Shower again with the CHG soap on the day of surgery prior to arriving at the hospital.  14. Do not apply any deodorants/lotions/powders.  15. Please wear clean clothes to the hospital.

## 2023-01-27 ENCOUNTER — Ambulatory Visit
Admission: RE | Admit: 2023-01-27 | Discharge: 2023-01-27 | Disposition: A | Discharge status: Home / Self Care | Payer: Medicare HMO | Source: Ambulatory Visit | Attending: General Surgery | Admitting: General Surgery

## 2023-01-27 DIAGNOSIS — D241 Benign neoplasm of right breast: Secondary | ICD-10-CM

## 2023-01-27 DIAGNOSIS — R928 Other abnormal and inconclusive findings on diagnostic imaging of breast: Secondary | ICD-10-CM | POA: Diagnosis not present

## 2023-01-27 HISTORY — PX: BREAST BIOPSY: SHX20

## 2023-01-27 MED ORDER — LIDOCAINE HCL (PF) 1 % IJ SOLN
10.0000 mL | Freq: Once | INTRAMUSCULAR | Status: AC
  Administered 2023-01-27: 10 mL

## 2023-01-27 MED ORDER — LIDOCAINE HCL (PF) 1 % IJ SOLN
10.0000 mL | Freq: Once | INTRAMUSCULAR | Status: AC
  Administered 2023-01-27: 10 mL
  Filled 2023-01-27: qty 10

## 2023-01-28 MED ORDER — CEFAZOLIN SODIUM-DEXTROSE 2-4 GM/100ML-% IV SOLN
2.0000 g | INTRAVENOUS | Status: AC
  Administered 2023-01-29: 2 g via INTRAVENOUS

## 2023-01-28 MED ORDER — ORAL CARE MOUTH RINSE: 15.0000 mL | Freq: Once | OROMUCOSAL | Status: AC

## 2023-01-28 MED ORDER — LACTATED RINGERS IV SOLN: INTRAVENOUS | Status: DC

## 2023-01-28 MED ORDER — CHLORHEXIDINE GLUCONATE 0.12 % MT SOLN: 15.0000 mL | Freq: Once | OROMUCOSAL | Status: AC

## 2023-01-28 MED ORDER — FAMOTIDINE 20 MG PO TABS: 20.0000 mg | ORAL_TABLET | Freq: Once | ORAL | Status: AC

## 2023-01-29 ENCOUNTER — Ambulatory Visit: Payer: Medicare HMO

## 2023-01-29 ENCOUNTER — Other Ambulatory Visit: Payer: Self-pay

## 2023-01-29 ENCOUNTER — Encounter
Admission: RE | Disposition: A | Discharge status: Home / Self Care | Payer: Self-pay | Source: Home / Self Care | Attending: General Surgery

## 2023-01-29 ENCOUNTER — Encounter: Payer: Self-pay | Admitting: General Surgery

## 2023-01-29 ENCOUNTER — Ambulatory Visit: Payer: Medicare HMO | Admitting: Urgent Care

## 2023-01-29 ENCOUNTER — Ambulatory Visit
Admission: RE | Admit: 2023-01-29 | Discharge: 2023-01-29 | Disposition: A | Discharge status: Home / Self Care | Payer: Medicare HMO | Source: Ambulatory Visit | Attending: General Surgery | Admitting: General Surgery

## 2023-01-29 ENCOUNTER — Ambulatory Visit: Payer: Medicare HMO | Admitting: Certified Registered Nurse Anesthetist

## 2023-01-29 ENCOUNTER — Ambulatory Visit
Admission: RE | Admit: 2023-01-29 | Discharge: 2023-01-29 | Disposition: A | Discharge status: Home / Self Care | Payer: Medicare HMO | Attending: General Surgery | Admitting: General Surgery

## 2023-01-29 DIAGNOSIS — M1990 Unspecified osteoarthritis, unspecified site: Secondary | ICD-10-CM | POA: Diagnosis not present

## 2023-01-29 DIAGNOSIS — G4730 Sleep apnea, unspecified: Secondary | ICD-10-CM | POA: Diagnosis not present

## 2023-01-29 DIAGNOSIS — I10 Essential (primary) hypertension: Secondary | ICD-10-CM | POA: Diagnosis not present

## 2023-01-29 DIAGNOSIS — D241 Benign neoplasm of right breast: Secondary | ICD-10-CM | POA: Diagnosis not present

## 2023-01-29 DIAGNOSIS — N6042 Mammary duct ectasia of left breast: Secondary | ICD-10-CM | POA: Diagnosis not present

## 2023-01-29 DIAGNOSIS — N6041 Mammary duct ectasia of right breast: Secondary | ICD-10-CM | POA: Diagnosis not present

## 2023-01-29 DIAGNOSIS — N6489 Other specified disorders of breast: Secondary | ICD-10-CM | POA: Insufficient documentation

## 2023-01-29 DIAGNOSIS — D242 Benign neoplasm of left breast: Secondary | ICD-10-CM | POA: Diagnosis not present

## 2023-01-29 DIAGNOSIS — R928 Other abnormal and inconclusive findings on diagnostic imaging of breast: Secondary | ICD-10-CM | POA: Diagnosis not present

## 2023-01-29 DIAGNOSIS — R918 Other nonspecific abnormal finding of lung field: Secondary | ICD-10-CM | POA: Diagnosis not present

## 2023-01-29 DIAGNOSIS — Z79899 Other long term (current) drug therapy: Secondary | ICD-10-CM | POA: Diagnosis not present

## 2023-01-29 DIAGNOSIS — J45909 Unspecified asthma, uncomplicated: Secondary | ICD-10-CM | POA: Insufficient documentation

## 2023-01-29 HISTORY — PX: BREAST BIOPSY WITH RADIO FREQUENCY LOCALIZER: SHX6895

## 2023-01-29 SURGERY — BREAST BIOPSY WITH RADIO FREQUENCY LOCALIZER
Anesthesia: General | Site: Breast | Laterality: Bilateral

## 2023-01-29 MED ORDER — ACETAMINOPHEN 10 MG/ML IV SOLN
INTRAVENOUS | Status: AC
  Filled 2023-01-29: qty 100

## 2023-01-29 MED ORDER — DEXAMETHASONE SODIUM PHOSPHATE 10 MG/ML IJ SOLN
INTRAMUSCULAR | Status: AC
  Filled 2023-01-29: qty 2

## 2023-01-29 MED ORDER — DEXAMETHASONE SODIUM PHOSPHATE 10 MG/ML IJ SOLN
INTRAMUSCULAR | Status: DC | PRN
  Administered 2023-01-29: 10 mg via INTRAVENOUS

## 2023-01-29 MED ORDER — KETOROLAC TROMETHAMINE 30 MG/ML IJ SOLN
INTRAMUSCULAR | Status: DC | PRN
  Administered 2023-01-29: 15 mg via INTRAVENOUS

## 2023-01-29 MED ORDER — PROPOFOL 10 MG/ML IV BOLUS
INTRAVENOUS | Status: AC
  Filled 2023-01-29: qty 20

## 2023-01-29 MED ORDER — FAMOTIDINE 20 MG PO TABS
ORAL_TABLET | ORAL | Status: AC
  Administered 2023-01-29: 20 mg via ORAL
  Filled 2023-01-29: qty 1

## 2023-01-29 MED ORDER — CHLORHEXIDINE GLUCONATE 0.12 % MT SOLN
OROMUCOSAL | Status: AC
  Administered 2023-01-29: 15 mL via OROMUCOSAL
  Filled 2023-01-29: qty 15

## 2023-01-29 MED ORDER — BUPIVACAINE HCL (PF) 0.5 % IJ SOLN
INTRAMUSCULAR | Status: AC
  Filled 2023-01-29: qty 30

## 2023-01-29 MED ORDER — LIDOCAINE HCL (CARDIAC) PF 100 MG/5ML IV SOSY
PREFILLED_SYRINGE | INTRAVENOUS | Status: DC | PRN
  Administered 2023-01-29: 100 mg via INTRAVENOUS

## 2023-01-29 MED ORDER — PHENYLEPHRINE 80 MCG/ML (10ML) SYRINGE FOR IV PUSH (FOR BLOOD PRESSURE SUPPORT)
PREFILLED_SYRINGE | INTRAVENOUS | Status: AC
  Filled 2023-01-29: qty 10

## 2023-01-29 MED ORDER — PROPOFOL 10 MG/ML IV BOLUS
INTRAVENOUS | Status: DC | PRN
  Administered 2023-01-29: 40 mg via INTRAVENOUS
  Administered 2023-01-29: 110 mg via INTRAVENOUS

## 2023-01-29 MED ORDER — GLYCOPYRROLATE 0.2 MG/ML IJ SOLN
INTRAMUSCULAR | Status: AC
  Filled 2023-01-29: qty 1

## 2023-01-29 MED ORDER — KETOROLAC TROMETHAMINE 30 MG/ML IJ SOLN
INTRAMUSCULAR | Status: AC
  Filled 2023-01-29: qty 1

## 2023-01-29 MED ORDER — LIDOCAINE HCL (PF) 2 % IJ SOLN
INTRAMUSCULAR | Status: AC
  Filled 2023-01-29: qty 5

## 2023-01-29 MED ORDER — DEXMEDETOMIDINE HCL IN NACL 80 MCG/20ML IV SOLN
INTRAVENOUS | Status: DC | PRN
  Administered 2023-01-29 (×3): 4 ug via BUCCAL

## 2023-01-29 MED ORDER — PHENYLEPHRINE HCL-NACL 20-0.9 MG/250ML-% IV SOLN
INTRAVENOUS | Status: DC | PRN
  Administered 2023-01-29: 20 ug/min via INTRAVENOUS

## 2023-01-29 MED ORDER — ONDANSETRON HCL 4 MG/2ML IJ SOLN
INTRAMUSCULAR | Status: DC | PRN
  Administered 2023-01-29: 4 mg via INTRAVENOUS

## 2023-01-29 MED ORDER — BUPIVACAINE-EPINEPHRINE (PF) 0.5% -1:200000 IJ SOLN
INTRAMUSCULAR | Status: DC | PRN
  Administered 2023-01-29: 15 mL

## 2023-01-29 MED ORDER — PHENYLEPHRINE HCL-NACL 20-0.9 MG/250ML-% IV SOLN
INTRAVENOUS | Status: AC
  Filled 2023-01-29: qty 250

## 2023-01-29 MED ORDER — PHENYLEPHRINE 80 MCG/ML (10ML) SYRINGE FOR IV PUSH (FOR BLOOD PRESSURE SUPPORT)
PREFILLED_SYRINGE | INTRAVENOUS | Status: DC | PRN
  Administered 2023-01-29 (×3): 80 ug via INTRAVENOUS

## 2023-01-29 MED ORDER — HYDROCODONE-ACETAMINOPHEN 7.5-325 MG PO TABS
ORAL_TABLET | ORAL | Status: AC
  Filled 2023-01-29: qty 1

## 2023-01-29 MED ORDER — HYDROCODONE-ACETAMINOPHEN 5-325 MG PO TABS: 1.0000 | ORAL_TABLET | ORAL | 0 refills | Status: AC | PRN

## 2023-01-29 MED ORDER — STERILE WATER FOR IRRIGATION IR SOLN
Status: DC | PRN
  Administered 2023-01-29: 10 mL

## 2023-01-29 MED ORDER — OXYCODONE HCL 5 MG PO TABS
5.0000 mg | ORAL_TABLET | ORAL | Status: AC | PRN
  Administered 2023-01-29: 5 mg via ORAL

## 2023-01-29 MED ORDER — CEFAZOLIN SODIUM-DEXTROSE 2-4 GM/100ML-% IV SOLN
INTRAVENOUS | Status: AC
  Filled 2023-01-29: qty 100

## 2023-01-29 MED ORDER — FENTANYL CITRATE (PF) 100 MCG/2ML IJ SOLN
INTRAMUSCULAR | Status: DC | PRN
  Administered 2023-01-29 (×4): 25 ug via INTRAVENOUS

## 2023-01-29 MED ORDER — ACETAMINOPHEN 325 MG PO TABS: 325.0000 mg | ORAL_TABLET | ORAL | Status: DC | PRN

## 2023-01-29 MED ORDER — ACETAMINOPHEN 160 MG/5ML PO SOLN: 325.0000 mg | ORAL | Status: DC | PRN

## 2023-01-29 MED ORDER — DROPERIDOL 2.5 MG/ML IJ SOLN: 0.6250 mg | Freq: Once | INTRAMUSCULAR | Status: DC | PRN

## 2023-01-29 MED ORDER — EPINEPHRINE PF 1 MG/ML IJ SOLN
INTRAMUSCULAR | Status: AC
  Filled 2023-01-29: qty 1

## 2023-01-29 MED ORDER — FENTANYL CITRATE (PF) 100 MCG/2ML IJ SOLN: 25.0000 ug | INTRAMUSCULAR | Status: DC | PRN

## 2023-01-29 MED ORDER — OXYCODONE HCL 5 MG PO TABS
ORAL_TABLET | ORAL | Status: AC
  Filled 2023-01-29: qty 1

## 2023-01-29 MED ORDER — ACETAMINOPHEN 10 MG/ML IV SOLN
INTRAVENOUS | Status: DC | PRN
  Administered 2023-01-29: 1000 mg via INTRAVENOUS

## 2023-01-29 MED ORDER — ONDANSETRON HCL 4 MG/2ML IJ SOLN: 4.0000 mg | Freq: Once | INTRAMUSCULAR | Status: DC | PRN

## 2023-01-29 MED ORDER — HYDROCODONE-ACETAMINOPHEN 7.5-325 MG PO TABS: 1.0000 | ORAL_TABLET | Freq: Once | ORAL | Status: DC | PRN

## 2023-01-29 MED ORDER — ONDANSETRON HCL 4 MG/2ML IJ SOLN
INTRAMUSCULAR | Status: AC
  Filled 2023-01-29: qty 6

## 2023-01-29 MED ORDER — ROCURONIUM BROMIDE 10 MG/ML (PF) SYRINGE
PREFILLED_SYRINGE | INTRAVENOUS | Status: AC
  Filled 2023-01-29: qty 10

## 2023-01-29 MED ORDER — FENTANYL CITRATE (PF) 100 MCG/2ML IJ SOLN
INTRAMUSCULAR | Status: AC
  Filled 2023-01-29: qty 2

## 2023-01-29 SURGICAL SUPPLY — 43 items
ADH SKN CLS APL DERMABOND .7 (GAUZE/BANDAGES/DRESSINGS) ×1
APL PRP STRL LF DISP 70% ISPRP (MISCELLANEOUS) ×1
BLADE SURG 15 STRL LF DISP TIS (BLADE) ×1 IMPLANT
BLADE SURG 15 STRL SS (BLADE) ×1
CHLORAPREP W/TINT 26 (MISCELLANEOUS) ×1 IMPLANT
CNTNR URN SCR LID CUP LEK RST (MISCELLANEOUS) ×1 IMPLANT
CONT SPEC 4OZ STRL OR WHT (MISCELLANEOUS) ×1
DERMABOND ADVANCED .7 DNX12 (GAUZE/BANDAGES/DRESSINGS) ×1 IMPLANT
DEVICE DUBIN SPECIMEN MAMMOGRA (MISCELLANEOUS) ×1 IMPLANT
DRAPE LAPAROTOMY TRNSV 106X77 (MISCELLANEOUS) ×1 IMPLANT
ELECT CAUTERY BLADE TIP 2.5 (TIP) ×1
ELECT REM PT RETURN 9FT ADLT (ELECTROSURGICAL) ×1
ELECTRODE CAUTERY BLDE TIP 2.5 (TIP) ×1 IMPLANT
ELECTRODE REM PT RTRN 9FT ADLT (ELECTROSURGICAL) ×1 IMPLANT
GAUZE 4X4 16PLY ~~LOC~~+RFID DBL (SPONGE) ×1 IMPLANT
GLOVE BIO SURGEON STRL SZ 6.5 (GLOVE) ×1 IMPLANT
GLOVE BIOGEL PI IND STRL 6.5 (GLOVE) ×1 IMPLANT
GOWN STRL REUS W/ TWL LRG LVL3 (GOWN DISPOSABLE) ×3 IMPLANT
GOWN STRL REUS W/TWL LRG LVL3 (GOWN DISPOSABLE) ×3
KIT MARKER MARGIN INK (KITS) IMPLANT
KIT TURNOVER KIT A (KITS) ×1 IMPLANT
LABEL OR SOLS (LABEL) ×1 IMPLANT
MANIFOLD NEPTUNE II (INSTRUMENTS) ×1 IMPLANT
MARKER MARGIN CORRECT CLIP (MARKER) IMPLANT
NDL HYPO 25X1 1.5 SAFETY (NEEDLE) ×1 IMPLANT
NEEDLE HYPO 25X1 1.5 SAFETY (NEEDLE) ×1 IMPLANT
PACK BASIN MINOR ARMC (MISCELLANEOUS) ×1 IMPLANT
RETRACTOR RING XSMALL (MISCELLANEOUS) IMPLANT
RTRCTR WOUND ALEXIS 13CM XS SH (MISCELLANEOUS)
SET LOCALIZER 20 PROBE US (MISCELLANEOUS) ×1 IMPLANT
SPONGE T-LAP 18X18 ~~LOC~~+RFID (SPONGE) IMPLANT
SUT MNCRL 4-0 (SUTURE) ×1
SUT MNCRL 4-0 27XMFL (SUTURE) ×1
SUT SILK 2 0 SH (SUTURE) ×1 IMPLANT
SUT VIC AB 3-0 SH 27 (SUTURE) ×1
SUT VIC AB 3-0 SH 27X BRD (SUTURE) ×1 IMPLANT
SUTURE MNCRL 4-0 27XMF (SUTURE) ×1 IMPLANT
SYR 10ML LL (SYRINGE) ×1 IMPLANT
SYR BULB IRRIG 60ML STRL (SYRINGE) ×1 IMPLANT
TRAP FLUID SMOKE EVACUATOR (MISCELLANEOUS) ×1 IMPLANT
TRAP NEPTUNE SPECIMEN COLLECT (MISCELLANEOUS) ×1 IMPLANT
WATER STERILE IRR 1000ML POUR (IV SOLUTION) ×1 IMPLANT
WATER STERILE IRR 500ML POUR (IV SOLUTION) ×1 IMPLANT

## 2023-01-29 NOTE — Anesthesia Postprocedure Evaluation (Signed)
Anesthesia Post Note  Patient: Alexa Chambers  Procedure(s) Performed: BREAST BIOPSY WITH RADIO FREQUENCY LOCALIZER (Bilateral: Breast)  Patient location during evaluation: PACU Anesthesia Type: General Level of consciousness: awake and alert Pain management: pain level controlled Vital Signs Assessment: post-procedure vital signs reviewed and stable Respiratory status: spontaneous breathing, nonlabored ventilation and respiratory function stable Cardiovascular status: blood pressure returned to baseline and stable Postop Assessment: no apparent nausea or vomiting Anesthetic complications: no  No notable events documented.   Last Vitals:  Vitals:   01/29/23 1330 01/29/23 1347  BP: 112/76 127/85  Pulse: 77 81  Resp: 17 17  Temp: (!) 36.4 C (!) 36.1 C  SpO2: 94% 97%    Last Pain:  Vitals:   01/29/23 1347  TempSrc: Temporal  PainSc: 4                  Malka Bocek Harvie Heck

## 2023-01-29 NOTE — Interval H&P Note (Signed)
History and Physical Interval Note:  01/29/2023 10:01 AM  Alexa Chambers  has presented today for surgery, with the diagnosis of D024.1  D24.2  Intraductal papilloma of breast, Rt and Lt.  The various methods of treatment have been discussed with the patient and family. After consideration of risks, benefits and other options for treatment, the patient has consented to  Procedure(s): BREAST BIOPSY WITH RADIO FREQUENCY LOCALIZER (Bilateral) as a surgical intervention.  The patient's history has been reviewed, patient examined, no change in status, stable for surgery.  I have reviewed the patient's chart and labs.  Questions were answered to the patient's satisfaction.     Herbert Pun

## 2023-01-29 NOTE — Anesthesia Procedure Notes (Signed)
Procedure Name: LMA Insertion Date/Time: 01/29/2023 10:29 AM  Performed by: Lily Peer, Gentry Pilson, CRNAPre-anesthesia Checklist: Patient identified, Emergency Drugs available, Suction available and Patient being monitored Patient Re-evaluated:Patient Re-evaluated prior to induction Oxygen Delivery Method: Circle system utilized Preoxygenation: Pre-oxygenation with 100% oxygen Induction Type: IV induction Ventilation: Mask ventilation without difficulty LMA: LMA inserted LMA Size: 3.0 Number of attempts: 1 Placement Confirmation: positive ETCO2 and breath sounds checked- equal and bilateral Tube secured with: Tape Dental Injury: Teeth and Oropharynx as per pre-operative assessment

## 2023-01-29 NOTE — Transfer of Care (Signed)
Immediate Anesthesia Transfer of Care Note  Patient: Alexa Chambers  Procedure(s) Performed: BREAST BIOPSY WITH RADIO FREQUENCY LOCALIZER (Bilateral: Breast)  Patient Location: PACU  Anesthesia Type:General  Level of Consciousness: awake and alert   Airway & Oxygen Therapy: Patient Spontanous Breathing and Patient connected to face mask oxygen  Post-op Assessment: Report given to RN and Post -op Vital signs reviewed and stable  Post vital signs: Reviewed and stable  Last Vitals:  Vitals Value Taken Time  BP 112/77 01/29/23 1211  Temp    Pulse 87 01/29/23 1215  Resp 11 01/29/23 1215  SpO2 95 % 01/29/23 1215  Vitals shown include unvalidated device data.  Last Pain:  Vitals:   01/29/23 0924  TempSrc: Temporal         Complications: No notable events documented.

## 2023-01-29 NOTE — Anesthesia Preprocedure Evaluation (Addendum)
Anesthesia Evaluation  Patient identified by MRN, date of birth, ID band Patient awake    Reviewed: Allergy & Precautions, NPO status , Patient's Chart, lab work & pertinent test results  Airway Mallampati: III  TM Distance: >3 FB Neck ROM: full    Dental  (+) Chipped   Pulmonary neg pulmonary ROS, asthma , sleep apnea and Continuous Positive Airway Pressure Ventilation  Nightly CPAP use   Pulmonary exam normal breath sounds clear to auscultation       Cardiovascular hypertension, Pt. on medications negative cardio ROS Normal cardiovascular exam     Neuro/Psych Seizures -, Well Controlled,  negative neurological ROS  negative psych ROS   GI/Hepatic negative GI ROS, Neg liver ROS,neg GERD  ,,  Endo/Other  negative endocrine ROS    Renal/GU      Musculoskeletal  (+) Arthritis ,    Abdominal   Peds  Hematology negative hematology ROS (+)   Anesthesia Other Findings Past Medical History: No date: Arthritis     Comment:  neck No date: Asthma     Comment:  seasonal allergies, molds and mildews No date: Hypertension No date: Seizure ([place])     Comment:  in teens and last at 50yrs No date: Sleep apnea     Comment:  uses cpap 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  Past Surgical History: 11/24/2022: BREAST BIOPSY; Right     Comment:  [place] Core Bx, Heart clip, path pending 11/24/2022: BREAST BIOPSY; Right     Comment:  [place] RT BREAST BX W LOC DEV 1ST LESION IMG BX [place]               GUIDE 11/24/2022 [facility] 01/27/2023: BREAST BIOPSY; Right     Comment:  MM RT RADIO FREQUENCY TAG LOC MAMMO GUIDE 01/27/2023               [facility] 1980: BREAST EXCISIONAL BIOPSY; Left     Comment:  neg 03/19/2022: CHONDROPLASTY; Right     Comment:  Procedure: CHONDROPLASTY;  Surgeon: Leim Fabry, MD;                Location: [place];  Service: Orthopedics;                Laterality:  Right; 03/19/2022: KNEE ARTHROSCOPY; Right     Comment:  Procedure: Right knee arthroscopy, medial meniscus root               repair, chondroplasty;  Surgeon: Leim Fabry, MD;                Location: [place];  Service: Orthopedics;                Laterality: Right; 1984: lymph nodes removed 2000: REDUCTION MAMMAPLASTY; Bilateral  BMI    Body Mass Index: 35.61 kg/m      Reproductive/Obstetrics negative OB ROS                             Anesthesia Physical Anesthesia Plan  ASA: 2  Anesthesia Plan: General LMA   Post-op Pain Management:    Induction: Intravenous  PONV Risk Score and Plan: Dexamethasone, Ondansetron, Midazolam and Treatment may vary due to age or medical condition  Airway Management Planned: LMA  Additional Equipment:   Intra-op Plan:   Post-operative Plan: Extubation in OR  Informed Consent: I have reviewed the patients  History and Physical, chart, labs and discussed the procedure including the risks, benefits and alternatives for the proposed anesthesia with the patient or authorized representative who has indicated his/her understanding and acceptance.     Dental Advisory Given  Plan Discussed with: Anesthesiologist, CRNA and Surgeon  Anesthesia Plan Comments: (Patient consented for risks of anesthesia including but not limited to:  - adverse reactions to medications - damage to eyes, teeth, lips or other oral mucosa - nerve damage due to positioning  - sore throat or hoarseness - Damage to heart, brain, nerves, lungs, other parts of body or loss of life  Patient voiced understanding.)       Anesthesia Quick Evaluation

## 2023-01-29 NOTE — Discharge Instructions (Addendum)
  Diet: Resume home heart healthy regular diet.   Activity: No heavy lifting >20 pounds (children, pets, laundry, garbage) or strenuous activity until follow-up, but light activity and walking are encouraged. Do not drive or drink alcohol if taking narcotic pain medications.  Wound care: May shower with soapy water and pat dry (do not rub incisions), but no baths or submerging incision underwater until follow-up. (no swimming)   Medications: Resume all home medications. For mild to moderate pain: acetaminophen (Tylenol) ***or ibuprofen (if no kidney disease). Combining Tylenol with alcohol can substantially increase your risk of causing liver disease. Narcotic pain medications, if prescribed, can be used for severe pain, though may cause nausea, constipation, and drowsiness. Do not combine Tylenol and Norco within a 6 hour period as Norco contains Tylenol. If you do not need the narcotic pain medication, you do not need to fill the prescription.  Call office ([phone]) at any time if any questions, worsening pain, fevers/chills, bleeding, drainage from incision site, or other concerns.   AMBULATORY SURGERY  DISCHARGE INSTRUCTIONS   The drugs that you were given will stay in your system until tomorrow so for the next 24 hours you should not:  Drive an automobile Make any legal decisions Drink any alcoholic beverage   You may resume regular meals tomorrow.  Today it is better to start with liquids and gradually work up to solid foods.  You may eat anything you prefer, but it is better to start with liquids, then soup and crackers, and gradually work up to solid foods.   Please notify your doctor immediately if you have any unusual bleeding, trouble breathing, redness and pain at the surgery site, drainage, fever, or pain not relieved by medication.    Additional Instructions:        Please contact your physician with any problems or Same Day Surgery at [phone], Monday  through Friday 6 am to 4 pm, or [facility] [facility] [place] [facility] number at [phone].  

## 2023-01-29 NOTE — Op Note (Signed)
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  Postoperative diagnosis: Same.   Procedure: Bilateral radiofrequency tag-localized excisional biopsy.                      Anesthesia: GETA  Surgeon: Dr. Windell Moment  Wound Classification: Clean  Indications: Patient is a 77 y.o. female with bilateral nipple discharge with nonpalpable bilateral breast masses noted on mammography and breast MRI with core biopsy demonstrating intraductal papilloma and requires radiofrequency tag-localized excisional biopsy to rule out malignancy.   Findings: 1. Specimen mammography shows marker and tag on each specimen 2. No other palpable mass or lymph node identified.   Description of procedure: Preoperative radiofrequency tag localization was performed by radiology. The patient was taken to the operating room and placed supine on the operating table, and after general anesthesia the both breasts were prepped and draped in the usual sterile fashion. A time-out was completed verifying correct patient, procedure, site, positioning, and implant(s) and/or special equipment prior to beginning this procedure.  Both excisional biopsies were done using the following technique: By comparing the localization studies and interrogation with Localizer device, the probable trajectory and location of the mass was visualized. A circumareolar skin incision was planned in such a way as to minimize the amount of dissection to reach the mass.  The skin incision was made. Flaps were raised and the location of the tag was confirmed with Localizer device confirmed.  Dissection was then taken down circumferentially, taking care to include the entire localizing tag and a wide margin of grossly normal tissue. The specimens and entire localizing tag were removed on each side. The specimens were oriented and sent to radiology with the localization studies. Confirmation was received that the entire target lesion had been  resected. The wound was irrigated. Hemostasis was checked. The wound was closed with interrupted sutures of 3-0 Vicryl and a subcuticular suture of Monocryl 3-0. No attempt was made to close the dead space.   Specimen: Right and Left excisional biopsies                      Complications: None  Estimated Blood Loss: 5 mL

## 2023-01-30 ENCOUNTER — Encounter: Payer: Self-pay | Admitting: General Surgery

## 2023-02-01 LAB — SURGICAL PATHOLOGY

## 2023-03-02 DIAGNOSIS — R7309 Other abnormal glucose: Secondary | ICD-10-CM | POA: Diagnosis not present

## 2023-03-02 DIAGNOSIS — I10 Essential (primary) hypertension: Secondary | ICD-10-CM | POA: Diagnosis not present

## 2023-03-02 DIAGNOSIS — R7989 Other specified abnormal findings of blood chemistry: Secondary | ICD-10-CM | POA: Diagnosis not present

## 2023-03-09 DIAGNOSIS — Z1211 Encounter for screening for malignant neoplasm of colon: Secondary | ICD-10-CM | POA: Diagnosis not present

## 2023-03-09 DIAGNOSIS — Z0000 Encounter for general adult medical examination without abnormal findings: Secondary | ICD-10-CM | POA: Diagnosis not present

## 2023-03-09 DIAGNOSIS — Z13820 Encounter for screening for osteoporosis: Secondary | ICD-10-CM | POA: Diagnosis not present

## 2023-03-09 DIAGNOSIS — J302 Other seasonal allergic rhinitis: Secondary | ICD-10-CM | POA: Diagnosis not present

## 2023-03-09 DIAGNOSIS — G4733 Obstructive sleep apnea (adult) (pediatric): Secondary | ICD-10-CM | POA: Diagnosis not present

## 2023-03-09 DIAGNOSIS — I10 Essential (primary) hypertension: Secondary | ICD-10-CM | POA: Diagnosis not present

## 2023-03-09 DIAGNOSIS — Z1331 Encounter for screening for depression: Secondary | ICD-10-CM | POA: Diagnosis not present

## 2023-03-09 DIAGNOSIS — Z6836 Body mass index (BMI) 36.0-36.9, adult: Secondary | ICD-10-CM | POA: Diagnosis not present

## 2023-07-29 DIAGNOSIS — Z86010 Personal history of colonic polyps: Secondary | ICD-10-CM | POA: Diagnosis not present

## 2023-07-29 DIAGNOSIS — Z09 Encounter for follow-up examination after completed treatment for conditions other than malignant neoplasm: Secondary | ICD-10-CM | POA: Diagnosis not present

## 2023-09-06 DIAGNOSIS — G4733 Obstructive sleep apnea (adult) (pediatric): Secondary | ICD-10-CM | POA: Diagnosis not present

## 2023-09-06 DIAGNOSIS — Z780 Asymptomatic menopausal state: Secondary | ICD-10-CM | POA: Diagnosis not present

## 2023-09-06 DIAGNOSIS — I10 Essential (primary) hypertension: Secondary | ICD-10-CM | POA: Diagnosis not present

## 2023-09-06 DIAGNOSIS — Z1211 Encounter for screening for malignant neoplasm of colon: Secondary | ICD-10-CM | POA: Diagnosis not present

## 2023-09-06 DIAGNOSIS — M8588 Other specified disorders of bone density and structure, other site: Secondary | ICD-10-CM | POA: Diagnosis not present

## 2023-09-06 DIAGNOSIS — R7309 Other abnormal glucose: Secondary | ICD-10-CM | POA: Diagnosis not present

## 2023-09-06 DIAGNOSIS — Z6836 Body mass index (BMI) 36.0-36.9, adult: Secondary | ICD-10-CM | POA: Diagnosis not present

## 2023-09-06 DIAGNOSIS — J302 Other seasonal allergic rhinitis: Secondary | ICD-10-CM | POA: Diagnosis not present

## 2023-09-06 DIAGNOSIS — R8290 Unspecified abnormal findings in urine: Secondary | ICD-10-CM | POA: Diagnosis not present

## 2023-09-06 DIAGNOSIS — Z13820 Encounter for screening for osteoporosis: Secondary | ICD-10-CM | POA: Diagnosis not present

## 2023-09-13 DIAGNOSIS — G4733 Obstructive sleep apnea (adult) (pediatric): Secondary | ICD-10-CM | POA: Diagnosis not present

## 2023-09-13 DIAGNOSIS — Z1331 Encounter for screening for depression: Secondary | ICD-10-CM | POA: Diagnosis not present

## 2023-09-13 DIAGNOSIS — I10 Essential (primary) hypertension: Secondary | ICD-10-CM | POA: Diagnosis not present

## 2023-09-13 DIAGNOSIS — J45909 Unspecified asthma, uncomplicated: Secondary | ICD-10-CM | POA: Diagnosis not present

## 2023-09-13 DIAGNOSIS — Z0000 Encounter for general adult medical examination without abnormal findings: Secondary | ICD-10-CM | POA: Diagnosis not present

## 2023-09-17 ENCOUNTER — Ambulatory Visit: Payer: Medicare HMO

## 2023-09-17 DIAGNOSIS — K635 Polyp of colon: Secondary | ICD-10-CM | POA: Diagnosis not present

## 2023-09-17 DIAGNOSIS — D122 Benign neoplasm of ascending colon: Secondary | ICD-10-CM | POA: Diagnosis not present

## 2023-09-17 DIAGNOSIS — K621 Rectal polyp: Secondary | ICD-10-CM | POA: Diagnosis not present

## 2023-09-17 DIAGNOSIS — Z860100 Personal history of colon polyps, unspecified: Secondary | ICD-10-CM | POA: Diagnosis not present

## 2023-09-17 DIAGNOSIS — D127 Benign neoplasm of rectosigmoid junction: Secondary | ICD-10-CM | POA: Diagnosis not present

## 2023-09-17 DIAGNOSIS — K640 First degree hemorrhoids: Secondary | ICD-10-CM | POA: Diagnosis not present

## 2023-09-17 DIAGNOSIS — Z1211 Encounter for screening for malignant neoplasm of colon: Secondary | ICD-10-CM | POA: Diagnosis not present

## 2023-11-12 DIAGNOSIS — I10 Essential (primary) hypertension: Secondary | ICD-10-CM | POA: Diagnosis not present

## 2023-11-12 DIAGNOSIS — R7309 Other abnormal glucose: Secondary | ICD-10-CM | POA: Diagnosis not present

## 2023-12-20 DIAGNOSIS — D2261 Melanocytic nevi of right upper limb, including shoulder: Secondary | ICD-10-CM | POA: Diagnosis not present

## 2023-12-20 DIAGNOSIS — D2262 Melanocytic nevi of left upper limb, including shoulder: Secondary | ICD-10-CM | POA: Diagnosis not present

## 2023-12-20 DIAGNOSIS — D2272 Melanocytic nevi of left lower limb, including hip: Secondary | ICD-10-CM | POA: Diagnosis not present

## 2023-12-20 DIAGNOSIS — D2271 Melanocytic nevi of right lower limb, including hip: Secondary | ICD-10-CM | POA: Diagnosis not present

## 2023-12-20 DIAGNOSIS — D485 Neoplasm of uncertain behavior of skin: Secondary | ICD-10-CM | POA: Diagnosis not present

## 2023-12-20 DIAGNOSIS — D235 Other benign neoplasm of skin of trunk: Secondary | ICD-10-CM | POA: Diagnosis not present

## 2023-12-20 DIAGNOSIS — D0461 Carcinoma in situ of skin of right upper limb, including shoulder: Secondary | ICD-10-CM | POA: Diagnosis not present

## 2023-12-20 DIAGNOSIS — B078 Other viral warts: Secondary | ICD-10-CM | POA: Diagnosis not present

## 2023-12-20 DIAGNOSIS — L821 Other seborrheic keratosis: Secondary | ICD-10-CM | POA: Diagnosis not present

## 2023-12-20 DIAGNOSIS — D225 Melanocytic nevi of trunk: Secondary | ICD-10-CM | POA: Diagnosis not present

## 2024-01-07 DIAGNOSIS — L905 Scar conditions and fibrosis of skin: Secondary | ICD-10-CM | POA: Diagnosis not present

## 2024-01-07 DIAGNOSIS — D225 Melanocytic nevi of trunk: Secondary | ICD-10-CM | POA: Diagnosis not present

## 2024-03-01 DIAGNOSIS — J4520 Mild intermittent asthma, uncomplicated: Secondary | ICD-10-CM | POA: Diagnosis not present

## 2024-03-01 DIAGNOSIS — I10 Essential (primary) hypertension: Secondary | ICD-10-CM | POA: Diagnosis not present

## 2024-03-01 DIAGNOSIS — G4733 Obstructive sleep apnea (adult) (pediatric): Secondary | ICD-10-CM | POA: Diagnosis not present

## 2024-03-01 DIAGNOSIS — R7309 Other abnormal glucose: Secondary | ICD-10-CM | POA: Diagnosis not present

## 2024-03-01 DIAGNOSIS — Z6837 Body mass index (BMI) 37.0-37.9, adult: Secondary | ICD-10-CM | POA: Diagnosis not present

## 2024-03-07 DIAGNOSIS — D0461 Carcinoma in situ of skin of right upper limb, including shoulder: Secondary | ICD-10-CM | POA: Diagnosis not present

## 2024-03-08 DIAGNOSIS — I10 Essential (primary) hypertension: Secondary | ICD-10-CM | POA: Diagnosis not present

## 2024-03-08 DIAGNOSIS — Z0000 Encounter for general adult medical examination without abnormal findings: Secondary | ICD-10-CM | POA: Diagnosis not present

## 2024-03-08 DIAGNOSIS — J302 Other seasonal allergic rhinitis: Secondary | ICD-10-CM | POA: Diagnosis not present

## 2024-03-08 DIAGNOSIS — G4733 Obstructive sleep apnea (adult) (pediatric): Secondary | ICD-10-CM | POA: Diagnosis not present

## 2024-03-08 DIAGNOSIS — Z1331 Encounter for screening for depression: Secondary | ICD-10-CM | POA: Diagnosis not present

## 2024-09-07 DIAGNOSIS — H25813 Combined forms of age-related cataract, bilateral: Secondary | ICD-10-CM | POA: Diagnosis not present

## 2024-09-19 DIAGNOSIS — I10 Essential (primary) hypertension: Secondary | ICD-10-CM | POA: Diagnosis not present

## 2024-09-19 DIAGNOSIS — R7309 Other abnormal glucose: Secondary | ICD-10-CM | POA: Diagnosis not present

## 2024-09-19 DIAGNOSIS — Z6834 Body mass index (BMI) 34.0-34.9, adult: Secondary | ICD-10-CM | POA: Diagnosis not present

## 2024-09-19 DIAGNOSIS — G4733 Obstructive sleep apnea (adult) (pediatric): Secondary | ICD-10-CM | POA: Diagnosis not present

## 2024-09-19 DIAGNOSIS — J302 Other seasonal allergic rhinitis: Secondary | ICD-10-CM | POA: Diagnosis not present

## 2024-09-26 DIAGNOSIS — Z23 Encounter for immunization: Secondary | ICD-10-CM | POA: Diagnosis not present

## 2024-09-26 DIAGNOSIS — N898 Other specified noninflammatory disorders of vagina: Secondary | ICD-10-CM | POA: Diagnosis not present

## 2024-09-26 DIAGNOSIS — J45909 Unspecified asthma, uncomplicated: Secondary | ICD-10-CM | POA: Diagnosis not present

## 2024-09-26 DIAGNOSIS — I10 Essential (primary) hypertension: Secondary | ICD-10-CM | POA: Diagnosis not present

## 2024-09-26 DIAGNOSIS — Z0000 Encounter for general adult medical examination without abnormal findings: Secondary | ICD-10-CM | POA: Diagnosis not present

## 2024-09-26 DIAGNOSIS — G4733 Obstructive sleep apnea (adult) (pediatric): Secondary | ICD-10-CM | POA: Diagnosis not present

## 2024-09-26 DIAGNOSIS — Z1231 Encounter for screening mammogram for malignant neoplasm of breast: Secondary | ICD-10-CM | POA: Diagnosis not present

## 2024-09-26 DIAGNOSIS — R7309 Other abnormal glucose: Secondary | ICD-10-CM | POA: Diagnosis not present

## 2024-09-26 DIAGNOSIS — N390 Urinary tract infection, site not specified: Secondary | ICD-10-CM | POA: Diagnosis not present

## 2024-12-14 ENCOUNTER — Other Ambulatory Visit: Payer: Self-pay | Admitting: Internal Medicine

## 2024-12-14 DIAGNOSIS — Z1231 Encounter for screening mammogram for malignant neoplasm of breast: Secondary | ICD-10-CM

## 2025-01-10 ENCOUNTER — Encounter
